# Patient Record
Sex: Male | Born: 1985 | Race: White | Hispanic: No | Marital: Single | Smoking: Former smoker
Health system: Southern US, Community
[De-identification: ages and names within clinical notes are randomized; demographics above are authoritative.]

## PROBLEM LIST (undated history)

## (undated) HISTORY — PX: HERNIA REPAIR: SHX51

---

## 2006-08-28 ENCOUNTER — Emergency Department (HOSPITAL_COMMUNITY): Admission: EM | Admit: 2006-08-28 | Discharge: 2006-08-28 | Payer: Self-pay | Admitting: Emergency Medicine

## 2007-08-17 ENCOUNTER — Emergency Department (HOSPITAL_COMMUNITY): Admission: EM | Admit: 2007-08-17 | Discharge: 2007-08-17 | Payer: Self-pay | Admitting: Emergency Medicine

## 2008-04-03 ENCOUNTER — Emergency Department (HOSPITAL_COMMUNITY): Admission: EM | Admit: 2008-04-03 | Discharge: 2008-04-03 | Payer: Self-pay | Admitting: Emergency Medicine

## 2011-01-27 ENCOUNTER — Inpatient Hospital Stay (HOSPITAL_COMMUNITY)
Admission: EM | Admit: 2011-01-27 | Discharge: 2011-01-28 | DRG: 918 | Discharge status: Against Medical Advice | Payer: Self-pay | Attending: Internal Medicine | Admitting: Internal Medicine

## 2011-01-27 DIAGNOSIS — F3289 Other specified depressive episodes: Secondary | ICD-10-CM | POA: Diagnosis present

## 2011-01-27 DIAGNOSIS — F329 Major depressive disorder, single episode, unspecified: Secondary | ICD-10-CM | POA: Diagnosis present

## 2011-01-27 DIAGNOSIS — F1210 Cannabis abuse, uncomplicated: Secondary | ICD-10-CM | POA: Diagnosis present

## 2011-01-27 DIAGNOSIS — T391X1A Poisoning by 4-Aminophenol derivatives, accidental (unintentional), initial encounter: Principal | ICD-10-CM | POA: Diagnosis present

## 2011-01-27 DIAGNOSIS — E876 Hypokalemia: Secondary | ICD-10-CM | POA: Diagnosis not present

## 2011-01-27 DIAGNOSIS — F17200 Nicotine dependence, unspecified, uncomplicated: Secondary | ICD-10-CM | POA: Diagnosis present

## 2011-01-27 DIAGNOSIS — T394X2A Poisoning by antirheumatics, not elsewhere classified, intentional self-harm, initial encounter: Secondary | ICD-10-CM | POA: Diagnosis present

## 2011-01-27 LAB — HEPATIC FUNCTION PANEL
ALT: 10 U/L (ref 0–53)
AST: 13 U/L (ref 0–37)
Alkaline Phosphatase: 55 U/L (ref 39–117)
Bilirubin, Direct: 0.1 mg/dL (ref 0.0–0.3)
Indirect Bilirubin: 0.5 mg/dL (ref 0.3–0.9)

## 2011-01-27 LAB — BASIC METABOLIC PANEL
Calcium: 9.6 mg/dL (ref 8.4–10.5)
Chloride: 101 mEq/L (ref 96–112)
Creatinine, Ser: 0.67 mg/dL (ref 0.4–1.5)
GFR calc Af Amer: >60 mL/min (ref >60)
Potassium: 3.5 mEq/L (ref 3.5–5.1)

## 2011-01-27 LAB — DIFFERENTIAL
Basophils Absolute: 0 10*3/uL (ref 0.0–0.1)
Basophils Relative: 1 % (ref 0–1)
Eosinophils Absolute: 0.1 10*3/uL (ref 0.0–0.7)
Eosinophils Relative: 1 % (ref 0–5)
Lymphocytes Relative: 29 % (ref 12–46)
Monocytes Relative: 6 % (ref 3–12)
Neutro Abs: 5.5 10*3/uL (ref 1.7–7.7)

## 2011-01-27 LAB — CBC
MCV: 88.5 fL (ref 78.0–100.0)
Platelets: 300 10*3/uL (ref 150–400)
RBC: 5.48 MIL/uL (ref 4.22–5.81)
WBC: 8.7 10*3/uL (ref 4.0–10.5)

## 2011-01-27 LAB — COMPREHENSIVE METABOLIC PANEL
AST: 14 U/L (ref 0–37)
Alkaline Phosphatase: 70 U/L (ref 39–117)
BUN: 10 mg/dL (ref 6–23)
Creatinine, Ser: 0.89 mg/dL (ref 0.4–1.5)
GFR calc Af Amer: >60 mL/min (ref >60)
Glucose, Bld: 98 mg/dL (ref 70–99)
Sodium: 141 mEq/L (ref 135–145)
Total Bilirubin: 0.4 mg/dL (ref 0.3–1.2)
Total Protein: 7.8 g/dL (ref 6.0–8.3)

## 2011-01-27 LAB — ACETAMINOPHEN LEVEL: Acetaminophen (Tylenol), Serum: <15 ug/mL (ref 10–30)

## 2011-01-27 LAB — RAPID URINE DRUG SCREEN, HOSP PERFORMED
Barbiturates: NOT DETECTED
Opiates: NOT DETECTED

## 2011-01-27 LAB — ETHANOL: Alcohol, Ethyl (B): 20 mg/dL — ABNORMAL HIGH (ref 0–10)

## 2011-01-27 LAB — MRSA PCR SCREENING: MRSA by PCR: NEGATIVE

## 2011-01-28 ENCOUNTER — Inpatient Hospital Stay (HOSPITAL_COMMUNITY)
Admission: AD | Admit: 2011-01-28 | Discharge: 2011-01-30 | DRG: 897 | Disposition: A | Discharge status: Home / Self Care | Payer: 59 | Attending: Psychiatry | Admitting: Psychiatry

## 2011-01-28 DIAGNOSIS — T391X1A Poisoning by 4-Aminophenol derivatives, accidental (unintentional), initial encounter: Secondary | ICD-10-CM

## 2011-01-28 DIAGNOSIS — F1210 Cannabis abuse, uncomplicated: Principal | ICD-10-CM

## 2011-01-28 DIAGNOSIS — T394X2A Poisoning by antirheumatics, not elsewhere classified, intentional self-harm, initial encounter: Secondary | ICD-10-CM

## 2011-01-28 DIAGNOSIS — F3289 Other specified depressive episodes: Secondary | ICD-10-CM

## 2011-01-28 DIAGNOSIS — F329 Major depressive disorder, single episode, unspecified: Secondary | ICD-10-CM

## 2011-01-28 DIAGNOSIS — Z560 Unemployment, unspecified: Secondary | ICD-10-CM

## 2011-01-28 DIAGNOSIS — F17200 Nicotine dependence, unspecified, uncomplicated: Secondary | ICD-10-CM

## 2011-01-28 DIAGNOSIS — E876 Hypokalemia: Secondary | ICD-10-CM

## 2011-01-28 DIAGNOSIS — F1010 Alcohol abuse, uncomplicated: Secondary | ICD-10-CM

## 2011-01-28 DIAGNOSIS — T398X2A Poisoning by other nonopioid analgesics and antipyretics, not elsewhere classified, intentional self-harm, initial encounter: Secondary | ICD-10-CM

## 2011-01-28 LAB — CBC
MCH: 30.5 pg (ref 26.0–34.0)
MCV: 88.2 fL (ref 78.0–100.0)
RBC: 5.24 MIL/uL (ref 4.22–5.81)
RDW: 12.7 % (ref 11.5–15.5)
WBC: 10.8 10*3/uL — ABNORMAL HIGH (ref 4.0–10.5)

## 2011-01-28 LAB — COMPREHENSIVE METABOLIC PANEL
Albumin: 4 g/dL (ref 3.5–5.2)
BUN: 9 mg/dL (ref 6–23)
CO2: 25 mEq/L (ref 19–32)
Chloride: 105 mEq/L (ref 96–112)
GFR calc Af Amer: >60 mL/min (ref >60)
Glucose, Bld: 111 mg/dL — ABNORMAL HIGH (ref 70–99)
Potassium: 3.3 mEq/L — ABNORMAL LOW (ref 3.5–5.1)

## 2011-01-28 LAB — PROTIME-INR: Prothrombin Time: 16.3 seconds — ABNORMAL HIGH (ref 11.6–15.2)

## 2011-01-28 LAB — DIFFERENTIAL
Basophils Relative: 0 % (ref 0–1)
Lymphs Abs: 3.7 10*3/uL (ref 0.7–4.0)
Monocytes Absolute: 0.9 10*3/uL (ref 0.1–1.0)

## 2011-01-29 DIAGNOSIS — Z630 Problems in relationship with spouse or partner: Secondary | ICD-10-CM

## 2011-01-29 NOTE — Discharge Summary (Signed)
NAME:  Erik Branch, HIGHLEY NO.:  [id number]  MEDICAL RECORD NO.:  [id number]           PATIENT TYPE:  I  LOCATION:                                 FACILITY:  PHYSICIAN:  Elliot Cousin, M.D.    DATE OF BIRTH:  12/30/1985  DATE OF ADMISSION:  01/28/2011 DATE OF DISCHARGE:  LH                         DISCHARGE SUMMARY-REFERRING   DISCHARGE DIAGNOSES: 1. Probable attempted suicide with intentional Tylenol (acetaminophen)     overdose. 2. The patient was involuntarily committed; however, he eloped from     the hospital.  The police authorities were notified.  The patient     had already been medically cleared for transfer to an inpatient     psychiatric facility. 3. Tobacco and marijuana abuse. 4. Hypokalemia. 5. Possible depression.  DISCHARGE MEDICATIONS:  None as the patient eloped from the hospital.  CONSULTATIONS:  The ACT Team representative, Shon Baton.  PROCEDURE PERFORMED:  None.  HISTORY OF PRESENT ILLNESS:  The patient is a 25 year old man with no significant past medical history, who was brought to the emergency department by the [facility] Police for evaluation of an apparent attempted suicide with acetaminophen, Windex, and AWESOME cleaner.  The patient admitted that he only took five Tylenol tablets and not the whole bottle.  He also stated that he poured the bottle of Windex and AWESOME down the drain although he wanted to make his fiance believe that he drunk both.  He also had written a suicide note, which was witnessed by the police.  In the emergency department, the patient was noted to be hemodynamically stable and afebrile.  His urine drug screen was positive only for THC.  His blood acetaminophen level was 21.7 (10-30 normal range).  His salicylate level was less than two.  His blood alcohol level was 20. His liver transaminases were all within normal limits.  He was admitted for further evaluation and management.  HOSPITAL COURSE:   The patient was started on acetylcysteine per the acetaminophen overdose protocol.  Maintenance IV fluids were initiated and continued for approximately 24 hours.  His blood acetaminophen level and liver transaminases were monitored closely.  Several hours after admission, his acetaminophen level decreased to less than 15.  The following morning, it remained at less than 15.  His liver transaminases remained well within normal limits.  His serum potassium did decrease to 3.3 which was likely the consequence of the IV fluids.  He was given 30 mEq of potassium chloride orally as ordered.  His TSH was within normal limits at 0.59.  His free T4 was within normal limits at 1.45.  His blood magnesium level was within normal limits at 2.0.  The ACT team was consulted.  Ms. Jacqulyn Ducking evaluated the patient and recommended further treatment in an inpatient psychiatric facility. The patient was informed that the recommendation was for him to be transferred to behavioral health for further inpatient treatment. Apparently, he signed the appropriate form for voluntary commitment. Nevertheless, Ms. Jacqulyn Ducking thought that the patient was a flight risk. Therefore, involuntary commitment forms were signed by the dictating physician.  Shortly after the forms had  been signed, the patient fleed his hospital room.  He was able to get away.  The [facility] Department was called immediately.  Currently, based on the latest report, he has not been apprehended.  Once he is apprehended, the patient should be transferred directly to [facility] as he was medically cleared.      Elliot Cousin, M.D.     DF/MEDQ  D:  01/28/2011  T:  01/28/2011  Job:  [id number]  Electronically Signed by Elliot Cousin M.D. on 01/29/2011 05:33:35 PM

## 2011-01-29 NOTE — H&P (Signed)
NAME:  Erik Branch, SMOLINSKY NO.:  [id number]  MEDICAL RECORD NO.:  [id number]           PATIENT TYPE:  I  LOCATION:  IC10                          FACILITY:  [facility]  PHYSICIAN:  Elliot Cousin, M.D.    DATE OF BIRTH:  02/17/1986  DATE OF ADMISSION:  01/27/2011 DATE OF DISCHARGE:  LH                             HISTORY & PHYSICAL   PRIMARY CARE PHYSICIAN:  The patient is unassigned.  CHIEF COMPLAINT:  Reported acetaminophen overdose and possible suicide attempt.  HISTORY OF PRESENT ILLNESS:  The patient is a 25 year old man with no significant past medical history, who presented to the emergency department today with a report that he took a bottle of Tylenol and drank Windex and another cleaner called Awesome this morning.  The patient admits that he was arguing with his fiance. He wanted to upset her and scare her.  Therefore, he gestured as if he had drunk a bottle of Windex and Awesome.  He admits taking Tylenol which he takes 2 to 3 daily for headaches.  He emptied the remaining Tylenol tablets in his back pocket and showed his fiance the empty bottle.  He states that he wanted her to assume that he had a drunk the two cleaners and took the bottle of Tylenol; however, he did not.  He actually poured the cleaners down the sink and as stated, placed the remaining Tylenol tablets in his pocket.  His fiance did call the police.  When they came, he denied any suicidal or homicidal thoughts.  His fiance was going to ask them to involuntarily commit him.  However, the patient voluntarily came with the police  to the emergency department at [facility].  According to the information given, there was a suicide note that the patient had written.  He now states that it was meant to scare his fiance, but he did not mean what is said in the note.  He does not recall exactly what was written in the suicide note.  He says now that he has a gun that is registered to him  and if he wanted to kill himself, he would have shot himself.  He strongly denies any feelings of suicide. He states that he wants to live for his son.  He has no prior history of suicide attempts.  He has no history of depression or any other psychiatric illnesses according to him.  During the initial evaluation, the patient was noted to be afebrile and hemodynamically stable.  In the emergency department, a number of studies were ordered.  His urine drug screen was positive only for THC. His blood alcohol level was 20.  His blood acetaminophen level was 21.7 (10-30 normal range).  His salicylate level was less than 2.  His liver transaminases were within normal limits.  He is being admitted for further evaluation and management.  PAST MEDICAL HISTORY: 1. Occasional headaches. 2. Tobacco abuse. 3. Marijuana use. 4. Status post hernia repair as an infant.  MEDICATIONS:  None.  ALLERGIES:  No known drug allergies.  SOCIAL HISTORY:  The patient is single.  He lives with  his fiancee.  He has one son.  He is currently unemployed.  He lives in [place].  He smokes 1 to 2 packs of cigarettes per day.  He smokes marijuana approximately 2 to 3 times per month.  He denies any other illicit drug use.  He drinks 3 to 4 beers weekly.  He did not graduate from high school.  He dropped out in the 11th grade.  FAMILY HISTORY:  His mother is 9 years of age and has no significant medical history.  His father is in his 40s.  He is essentially healthy. He has several siblings with no medical problems.  REVIEW OF SYSTEMS:  As above in the history present illness.  Otherwise his review of systems is negative.  PHYSICAL EXAMINATION:  VITAL SIGNS:  (The patient is being examined in the ICU).  Current temperature is 97.8, pulse 68, respiratory rate 218, oxygen saturation 98% on room air blood pressure 135/80. GENERAL:  The patient is a pleasant 25 year old Caucasian man who is currently sitting  up in bed in no acute distress. HEENT:  Head is normocephalic and atraumatic.  Pupils equal, round, and reactive to light.  Extraocular muscles are intact.  Conjunctivae are clear.  Sclerae are white.  Tympanic membranes not examined.  Nasal mucosa is mildly dry.  No sinus tenderness.  Oropharynx reveals good dentition.  Mucous membranes moist.  No posterior exudates or erythema. NECK:  Supple.  No adenopathy.  No thyromegaly.  No bruits.  No JVD. LUNGS:  Clear to auscultation bilaterally. HEART:  S1, S2 with borderline bradycardia. ABDOMEN:  Positive bowel sounds, soft, nontender, nondistended.  No hepatosplenomegaly.  No masses palpated. GU AND RECTAL:  Deferred. EXTREMITIES:  Pedal pulses are palpable bilaterally.  No pretibial edema and no pedal edema. NEUROLOGIC/PSYCHOLOGICAL:  The patient is alert and oriented x3.  His affect is actually pleasant.  He is very cooperative.  Cranial nerves II through XII are intact.  Strength is 5/5 throughout.  Sensation is intact.  The patient denies any feelings of suicide.  He states that he was only gesturing suicide to upset his fiancee.  He says that if he really wanted to kill himself, he would have used his gun to shoot himself.  When he was asked about the suicide note, he says that he wanted to make it seem real to upset his fiancee.  He now wishes that he has not written the note.  He wishes that he had not gestured suicide at all.  LABORATORY DATA:  Admission laboratories; salicylate level less than two.  Acetaminophen level 21.7, alcohol level 20.  Sodium 141, potassium 3.8, chloride 103, CO2 of 28, glucose 98, BUN 10, creatinine 0.89, total bilirubin 0.4, alkaline phosphatase 70, SGOT 14. SGPT 11, total protein 7.8, albumin 5.1, calcium 10.1.  Urine drug screen positive for THC. WBC 8.7, hemoglobin 16.8, platelet count 300.  ASSESSMENT: 1. The patient is a 25 year old man with no prior history of     depression or suicide  attempts, who is being admitted for a     possible suicide attempt with intentional Tylenol overdose.  It was     also reported that the patient drunk two cleaning solutions, but he     adamantly denies this.  He also denies taking more Tylenol than     what is prescribed.  His acetaminophen level is elevated, but not     in the toxic range.  He says that his last Tylenol dose was at  approximately 3:00 a.m. this morning.  He takes Tylenol on a     regular basis for treatment of headaches.  His liver transaminases     are currently within normal limits.  His EKG reveals normal sinus     rhythm with a heart rate of 60 beats per minute but no ST or T-wave     abnormalities.  He is hemodynamically stable and asymptomatic. 2. Tobacco and marijuana abuse.  The patient was advised to stop both.     A nicotine patch was placed earlier.  PLAN: 1. The patient was given a loading dose of intravenous acetylcysteine.     We will continue the acetylcysteine protocol intravenously. 2. We will start IV fluids empirically for added hydration. 3. We will order a one-on-one sitter for suicide precautions. 4. We will consult the ACT team in the morning as it is anticipated     that he will be medically cleared tomorrow. 5. Supportive treatment. 6. Tobacco cessation counseling.  Continue nicotine patch for now.     The patient was advised to stop smoking both marijuana and tobacco. 7. We will monitor his acetaminophen levels and liver transaminases     daily.     Elliot Cousin, M.D.     DF/MEDQ  D:  01/27/2011  T:  01/27/2011  Job:  [id number]  Electronically Signed by Elliot Cousin M.D. on 01/29/2011 05:19:46 PM

## 2011-01-30 NOTE — H&P (Signed)
NAME:  Erik Branch, Branch NO.:  [id number]  MEDICAL RECORD NO.:  [id number]           PATIENT TYPE:  I  LOCATION:  0503                          FACILITY:  [facility]  PHYSICIAN:  Franchot Gallo, MD     DATE OF BIRTH:  [birth year]  DATE OF ADMISSION:  01/28/2011 DATE OF DISCHARGE:                      PSYCHIATRIC ADMISSION ASSESSMENT   CHIEF COMPLAINT:  "I pretended like I was trying to kill myself."  HISTORY OF PRESENT ILLNESS:  Mr. Erik Branch Branch is a 25 year old single white male, who was admitted to the Behavioral Health after reportedly pretending that he had overdosed on a mixture of cleaning fluids and acetaminophen.  The patient states that he and his live-in girlfriend had argued and he took 2 bottles of cleaning fluid and "poured them down the drain," and then states that he took a bottle of Tylenol and poured some on the counter and then hid several in his pocket.  He reports that he then went to bed and pretended he had overdosed.  When his girlfriend returned from a walk, she reportedly called 911, and he was brought to the emergency room for clearance.  The patient states that he had been drinking beer at the time of this event.  Today, the patient states that he is sleeping well without difficulty and reports a good appetite.  He denies any significant feelings of sadness, anhedonia or depressed mood.  He also denies any suicidal or homicidal ideations.  The patient also denies any past attempts to actually harm himself.  The patient denies any past or current prolonged manic or hypomanic symptoms.  He also denies any auditory or visual hallucinations or delusional thinking.  The patient does report to occasionally drinking alcohol, but as stated above reports to drinking 6 beers prior to the above suicide "gesture." The patient reports smoking 2 packs of cigarettes per day and reports that he occasionally uses cannabis.  He reports that he was using cannabis  on a daily basis until the last several years, reporting "he can longer afford it."  The patient was admitted for evaluation of the above, as well as treatment recommendations.  PAST PSYCHIATRIC HISTORY:  The patient denies any past psychiatric hospitalizations or use of psychiatric medications.  PAST MEDICAL HISTORY:  CURRENT MEDICATIONS:  None reported.  ALLERGIES:  NKDA.  MEDICAL ILLNESSES:  None reported.  PAST OPERATIONS:  None reported.  FAMILY HISTORY:  The patient denies any family history of psychiatric or substance abuse related illnesses.  SOCIAL HISTORY:  The [place] with his 26-month-old son and his girlfriend.  He states that he completed the 11th grade of school and is currently unemployed.  The patient reports to smoking 2 packs of cigarettes per day, as well as occasional use of alcohol.  He reports a history of cannabis abuse, but denies any recent regular use of cannabis or other illicit drugs.  MENTAL STATUS EXAM:  General:  The patient was alert and oriented x3 and was friendly and cooperative throughout the evaluation.  Speech was appropriate in terms of rate and volume.  Mood appeared essentially euthymic.  Affect  was full.  Thoughts:  The patient denied any auditory or visual hallucinations or delusional thinking.  He also denied any suicidal or homicidal ideations.  Judgment and insight today both appeared fair to good.  IMPRESSION:  Axis  I: 1. Partner relational problem. 2. Cannabis abuse, episodic usage. 3. Rule out alcohol abuse. 4. Nicotine dependence. Axis II:  Deferred. Axis III:  None reported. Axis IV:  Limited primary support system.  Financial constraints. Unemployment. Axis V:  Global Assessment of Functioning at time of admission approximately 70.  Highest Global Assessment of Functioning in the past year also 70.  PLAN: 1. The patient was taken off if his holding orders since the  patient     agreed to remain in the hospital for further observation. 2. The patient will be monitored throughout the night and social work     will attempt to obtain collateral information from his family or     girlfriend. 3. Unless additional information is gathered, the patient will likely     be discharged in the a.m.    _________________________________ Franchot Gallo, MD     RR/MEDQ  D:  01/29/2011  T:  01/30/2011  Job:  [id number]  Electronically Signed by Franchot Gallo MD on 01/30/2011 04:24:55 PM

## 2011-02-01 NOTE — Discharge Summary (Signed)
  NAME:  MENA, LIENAU NO.:  [id number]  MEDICAL RECORD NO.:  [id number]           PATIENT TYPE:  I  LOCATION:  0503                          FACILITY:  [facility]  PHYSICIAN:  Franchot Gallo, MD     DATE OF BIRTH:  [birth year]  DATE OF ADMISSION:  01/28/2011 DATE OF DISCHARGE:  01/30/2011                              DISCHARGE SUMMARY   REASON FOR ADMISSION:  This is a 25 year old male who was admitted after he reportedly pretended that he overdosed on a mixture cleaning fluids and acetaminophen.  He and his live-in girlfriend had argued, and he reported taking 2 bottles of cleaning fluid and poured them down the drain and then, he states he took a bottle of Tylenol and poured some on the counter and hid them in his pocket.  FINAL IMPRESSION:  Axis I:  Partner relational problems; cannabis abuse, episodic usage; rule out alcohol abuse; nicotine dependence. Axis II:  Deferred. Axis III:  No known medical conditions. Axis IV:  Limited primary support, financial constraints and unemployment. Axis V:  Global Assessment of Functioning 70.  PERTINENT LABS:  Urine drug screen is positive for cannabis.  CMP within normal limits.  Alcohol level of 20.  Salicylate level less than 2. Acetaminophen level at 21.7.  CBC within normal limits.  SIGNIFICANT FINDINGS:  The patient was admitted to the adult milieu and to the mood disorder group.  The patient was alert, oriented, friendly and cooperative.  Speech was appropriate.  Mood appeared essentially euthymic.  Affect was full.  He denied any auditory or visual hallucinations or delusional thinking.  He denied any suicidal or homicidal thoughts.  His judgment and insight appeared to be good.  We released the patient from his involuntary petition, and he was monitored throughout the night to obtain collateral information from his support system.  We contacted the patient's girlfriend via telephone to gather collateral  information and to assess safety issues and to educate on suicide prevention.  Day of discharge, the patient was sleeping well. His appetite was good.  He denied any depressive symptoms.  He denied any suicidal or homicidal thoughts or auditory hallucinations.  The patient was stable for discharge.  He was discharged on no medications.  DISCHARGE MEDICATIONS:  None.  FOLLOWUP APPOINTMENT:  At [facility] on Wednesday, May 9 at 8 a.m., phone number [phone].     Landry Corporal, N.P.   ______________________________ Franchot Gallo, MD    JO/MEDQ  D:  01/31/2011  T:  01/31/2011  Job:  [id number]  Electronically Signed by Limmie PatriciaP. on 02/01/2011 09:15:40 AM Electronically Signed by Franchot Gallo MD on 02/01/2011 04:00:23 PM

## 2011-03-15 ENCOUNTER — Emergency Department (HOSPITAL_COMMUNITY)
Admission: EM | Admit: 2011-03-15 | Discharge: 2011-03-15 | Disposition: A | Discharge status: Home / Self Care | Payer: Self-pay | Attending: Emergency Medicine | Admitting: Emergency Medicine

## 2011-03-15 DIAGNOSIS — M65839 Other synovitis and tenosynovitis, unspecified forearm: Secondary | ICD-10-CM | POA: Insufficient documentation

## 2011-03-16 ENCOUNTER — Emergency Department (HOSPITAL_COMMUNITY)
Admission: EM | Admit: 2011-03-16 | Discharge: 2011-03-16 | Disposition: A | Discharge status: Home / Self Care | Payer: Self-pay | Attending: Emergency Medicine | Admitting: Emergency Medicine

## 2011-03-16 DIAGNOSIS — F1210 Cannabis abuse, uncomplicated: Secondary | ICD-10-CM | POA: Insufficient documentation

## 2011-03-16 DIAGNOSIS — M779 Enthesopathy, unspecified: Secondary | ICD-10-CM | POA: Insufficient documentation

## 2011-03-16 DIAGNOSIS — F17200 Nicotine dependence, unspecified, uncomplicated: Secondary | ICD-10-CM | POA: Insufficient documentation

## 2012-09-27 ENCOUNTER — Emergency Department (HOSPITAL_COMMUNITY)
Admission: EM | Admit: 2012-09-27 | Discharge: 2012-09-27 | Disposition: A | Discharge status: Home / Self Care | Payer: Self-pay | Attending: Emergency Medicine | Admitting: Emergency Medicine

## 2012-09-27 ENCOUNTER — Encounter (HOSPITAL_COMMUNITY): Payer: Self-pay | Admitting: *Deleted

## 2012-09-27 DIAGNOSIS — M542 Cervicalgia: Secondary | ICD-10-CM | POA: Insufficient documentation

## 2012-09-27 DIAGNOSIS — F17200 Nicotine dependence, unspecified, uncomplicated: Secondary | ICD-10-CM | POA: Insufficient documentation

## 2012-09-27 DIAGNOSIS — H6091 Unspecified otitis externa, right ear: Secondary | ICD-10-CM

## 2012-09-27 DIAGNOSIS — H60399 Other infective otitis externa, unspecified ear: Secondary | ICD-10-CM | POA: Insufficient documentation

## 2012-09-27 MED ORDER — CIPROFLOXACIN-DEXAMETHASONE 0.3-0.1 % OT SUSP
3.0000 [drp] | Freq: Two times a day (BID) | OTIC | Status: DC
Start: 2012-09-27 — End: 2012-09-28
  Administered 2012-09-27: 3 [drp] via OTIC
  Filled 2012-09-27: qty 7.5

## 2012-09-27 MED ORDER — CIPROFLOXACIN-DEXAMETHASONE 0.3-0.1 % OT SUSP
OTIC | Status: AC
  Filled 2012-09-27: qty 7.5

## 2012-09-27 NOTE — ED Notes (Signed)
Pt with pain to right ear x 1 month ago, started as a pimple per pt, states pain radiates down into neck, denies fever or N/V

## 2012-09-27 NOTE — ED Notes (Signed)
Pt instructed to take ear drops twice daily 3 drops for 7 days, pt verbalized understanding

## 2012-09-28 NOTE — ED Provider Notes (Signed)
History     CSN: [id number]  Arrival date & time 09/27/12  2052   First MD Initiated Contact with Patient 09/27/12 2108      Chief Complaint  Patient presents with  . Otalgia    (Consider location/radiation/quality/duration/timing/severity/associated sxs/prior treatment) HPI Comments: Erik Branch is a 27 y.o. Male with a history of a small pimple in his right ear canal which has been intermittent,  Stating he had one on the lower canal which resolved, but now has developed on the upper canal just inside the canal border.  There has been no drainage from the ear.  He reports constant pain which now radiates into his right lateral neck.  There has been no fevers,  Swelling and he denies hearing loss, dizziness or any other complaints.  He has taken ibuprofen without relief.     The history is provided by the patient.    History reviewed. No pertinent past medical history.  Past Surgical History  Procedure Date  . Hernia repair     History reviewed. No pertinent family history.  History  Substance Use Topics  . Smoking status: Current Every Day Smoker    Types: Cigarettes  . Smokeless tobacco: Not on file  . Alcohol Use: No      Review of Systems  Constitutional: Negative for fever.  HENT: Positive for ear pain and neck pain. Negative for hearing loss, congestion, sore throat, facial swelling, neck stiffness and tinnitus.   Respiratory: Negative for shortness of breath.     Allergies  Review of patient's allergies indicates no known allergies.  Home Medications   Current Outpatient Rx  Name  Route  Sig  Dispense  Refill  . IBUPROFEN 200 MG PO TABS   Oral   Take 800 mg by mouth every 6 (six) hours as needed. pain           BP 125/74  Pulse 64  Temp 98 F (36.7 C) (Oral)  Resp 18  Ht 5\' 4"  (1.626 m)  Wt 161 lb (73.029 kg)  BMI 27.64 kg/m2  SpO2 100%  Physical Exam  Constitutional: He is oriented to person, place, and time. He appears  well-developed and well-nourished.  HENT:  Head: Normocephalic and atraumatic.  Right Ear: Tympanic membrane normal.  Left Ear: Tympanic membrane and ear canal normal.  Nose: Mucosal edema and rhinorrhea present.  Mouth/Throat: Uvula is midline, oropharynx is clear and moist and mucous membranes are normal. Normal dentition. No oropharyngeal exudate, posterior oropharyngeal edema, posterior oropharyngeal erythema or tonsillar abscesses.       Small pustule superior distal right ear canal.  There is no drainage from the site.    Eyes: Conjunctivae normal are normal.  Cardiovascular: Normal rate and normal heart sounds.   Pulmonary/Chest: Effort normal. No respiratory distress. He has no wheezes. He has no rales.  Abdominal: Soft. There is no tenderness.  Musculoskeletal: Normal range of motion.  Lymphadenopathy:       No head or neck adenopathy.  Neurological: He is alert and oriented to person, place, and time.  Skin: Skin is warm and dry. No rash noted.  Psychiatric: He has a normal mood and affect.    ED Course  Procedures (including critical care time)  INCISION AND DRAINAGE Performed by: Burgess Amor Consent: Verbal consent obtained. Risks and benefits: risks, benefits and alternatives were discussed Type: abscess  Body area: right ear canal  Anesthesia: local infiltration  Needle aspiration with 18 gauge needle through pustule dome  Local anesthetic:none  Anesthetic total: none Complexity: simple Drainage: purulent  Drainage amount: small - cotton swab used with gentle pressure with small amount of purulence obtained Packing material: not applicable Patient tolerance: Patient tolerated the procedure well with no immediate complications.     Labs Reviewed - No data to display No results found.   1. External otitis of right ear       MDM  Pt prescribed ciprodex, encouraged bid application with cotton swab.          Burgess Amor, [place] 09/28/12 1624

## 2012-09-30 NOTE — ED Provider Notes (Signed)
Medical screening examination/treatment/procedure(s) were performed by non-physician practitioner and as supervising physician I was immediately available for consultation/collaboration.   Zell Doucette W. Edison Nicholson, MD 09/30/12 1110 

## 2013-02-19 ENCOUNTER — Emergency Department (HOSPITAL_COMMUNITY)
Admission: EM | Admit: 2013-02-19 | Discharge: 2013-02-19 | Disposition: A | Discharge status: Home / Self Care | Payer: Self-pay | Attending: Emergency Medicine | Admitting: Emergency Medicine

## 2013-02-19 ENCOUNTER — Encounter (HOSPITAL_COMMUNITY): Payer: Self-pay | Admitting: Emergency Medicine

## 2013-02-19 DIAGNOSIS — F17200 Nicotine dependence, unspecified, uncomplicated: Secondary | ICD-10-CM | POA: Insufficient documentation

## 2013-02-19 DIAGNOSIS — L0291 Cutaneous abscess, unspecified: Secondary | ICD-10-CM

## 2013-02-19 DIAGNOSIS — H538 Other visual disturbances: Secondary | ICD-10-CM | POA: Insufficient documentation

## 2013-02-19 DIAGNOSIS — H5789 Other specified disorders of eye and adnexa: Secondary | ICD-10-CM | POA: Insufficient documentation

## 2013-02-19 DIAGNOSIS — H00039 Abscess of eyelid unspecified eye, unspecified eyelid: Secondary | ICD-10-CM | POA: Insufficient documentation

## 2013-02-19 MED ORDER — BACITRACIN ZINC 500 UNIT/GM EX OINT
TOPICAL_OINTMENT | CUTANEOUS | Status: AC
  Administered 2013-02-19: 1
  Filled 2013-02-19: qty 0.9

## 2013-02-19 MED ORDER — SULFAMETHOXAZOLE-TMP DS 800-160 MG PO TABS
1.0000 | ORAL_TABLET | Freq: Once | ORAL | Status: AC
  Administered 2013-02-19: 1 via ORAL
  Filled 2013-02-19: qty 1

## 2013-02-19 MED ORDER — SULFAMETHOXAZOLE-TRIMETHOPRIM 800-160 MG PO TABS: 1.0000 | ORAL_TABLET | Freq: Two times a day (BID) | ORAL | Status: DC

## 2013-02-19 MED ORDER — HYDROCODONE-ACETAMINOPHEN 5-325 MG PO TABS: ORAL_TABLET | ORAL | Status: DC

## 2013-02-19 NOTE — ED Notes (Signed)
Pain, swelling rt eye , x 5 days, Upper lid swollen, red with pustule .

## 2013-02-19 NOTE — ED Provider Notes (Signed)
History     CSN: [id number]  Arrival date & time 02/19/13  1448   First MD Initiated Contact with Patient 02/19/13 1525      Chief Complaint  Patient presents with  . Eye Pain    (Consider location/radiation/quality/duration/timing/severity/associated sxs/prior treatment) HPI Comments: Patient c/o red "bump" to the medial right upper eyelid.  States he noticed a "whitehead" to it few days ago and now has tenderness , drainage and redness to the area.  He also states that his eye is crusted and swollen shut in the mornings.  He reports having slightly blurred vision to the right eye with medial gaze.  He denies fever, neck pain, headache, dizziness or contact use.    Patient is a 27 y.o. male presenting with eye pain. The history is provided by the patient.  Eye Pain This is a new problem. Episode onset: five days ago. The problem occurs constantly. The problem has been unchanged. Pertinent negatives include no arthralgias, chills, fever, headaches, nausea, neck pain, numbness, rash, sore throat, swollen glands, vertigo, vomiting or weakness. Exacerbated by: bright light and blinking. Treatments tried: warm compresses. The treatment provided no relief.    History reviewed. No pertinent past medical history.  Past Surgical History  Procedure Laterality Date  . Hernia repair      History reviewed. No pertinent family history.  History  Substance Use Topics  . Smoking status: Current Every Day Smoker    Types: Cigarettes  . Smokeless tobacco: Not on file  . Alcohol Use: No      Review of Systems  Constitutional: Negative for fever, chills, activity change and appetite change.  HENT: Negative for sore throat, facial swelling, neck pain and neck stiffness.   Eyes: Positive for pain, discharge and visual disturbance. Negative for photophobia.  Gastrointestinal: Negative for nausea and vomiting.  Musculoskeletal: Negative for arthralgias.  Skin: Negative for rash.    Neurological: Negative for dizziness, vertigo, weakness, numbness and headaches.  All other systems reviewed and are negative.    Allergies  Review of patient's allergies indicates no known allergies.  Home Medications   Current Outpatient Rx  Name  Route  Sig  Dispense  Refill  . ibuprofen (ADVIL,MOTRIN) 200 MG tablet   Oral   Take 800 mg by mouth every 6 (six) hours as needed. pain           BP 135/83  Pulse 110  SpO2 99%  Physical Exam  Nursing note and vitals reviewed. Constitutional: He is oriented to person, place, and time.  HENT:  Head: Normocephalic and atraumatic.  Right Ear: Tympanic membrane and ear canal normal.  Left Ear: Tympanic membrane and ear canal normal.  Mouth/Throat: Oropharynx is clear and moist.  Eyes: Conjunctivae and EOM are normal. Pupils are equal, round, and reactive to light. Right eye exhibits no chemosis, no discharge and no hordeolum.    Neck: Normal range of motion. Neck supple.  Cardiovascular: Normal rate, regular rhythm, normal heart sounds and intact distal pulses.   No murmur heard. Pulmonary/Chest: Effort normal and breath sounds normal. No respiratory distress.  Musculoskeletal: Normal range of motion.  Lymphadenopathy:    He has no cervical adenopathy.  Neurological: He is alert and oriented to person, place, and time. He exhibits normal muscle tone. Coordination normal.  Skin: Skin is warm and dry. No rash noted.    ED Course  Procedures (including critical care time)  Labs Reviewed - No data to display No results found.  MDM     [id number]  Patient also seen by the EDP, Dr. Estell Harpin and care plan discussed.  Pt agrees to frequent warm compresses and close f/u with Dr. Lita Mains tomorrow.  I have discussed the possible need for I&D in 24-48 hrs if no improvement. Pt encouraged to f/u with ophtho if I&D is warranted.  I will patch the eye for comfort and start Septra DS.  Sx's are likely related to MRSA.  No periorbital  edema or erythema.       Danayah Smyre L. Trisha Mangle, PA-C 02/19/13 2144

## 2013-02-19 NOTE — ED Provider Notes (Signed)
Medical screening examination/treatment/procedure(s) were performed by non-physician practitioner and as supervising physician I was immediately available for consultation/collaboration.   Benny Lennert, MD 02/19/13 2351

## 2013-02-19 NOTE — ED Notes (Signed)
Pt c/o r eye pain/swelling x 5 days. Swelling noted to top eyelid at present. Nad.

## 2013-06-28 ENCOUNTER — Emergency Department (HOSPITAL_COMMUNITY)
Admission: EM | Admit: 2013-06-28 | Discharge: 2013-06-28 | Disposition: A | Discharge status: Home / Self Care | Payer: Self-pay | Attending: Emergency Medicine | Admitting: Emergency Medicine

## 2013-06-28 ENCOUNTER — Encounter (HOSPITAL_COMMUNITY): Payer: Self-pay | Admitting: Emergency Medicine

## 2013-06-28 DIAGNOSIS — F17200 Nicotine dependence, unspecified, uncomplicated: Secondary | ICD-10-CM | POA: Insufficient documentation

## 2013-06-28 DIAGNOSIS — H60399 Other infective otitis externa, unspecified ear: Secondary | ICD-10-CM | POA: Insufficient documentation

## 2013-06-28 DIAGNOSIS — H6002 Abscess of left external ear: Secondary | ICD-10-CM

## 2013-06-28 MED ORDER — ANTIPYRINE-BENZOCAINE 5.4-1.4 % OT SOLN
OTIC | Status: AC
  Administered 2013-06-28: 22:00:00
  Filled 2013-06-28: qty 10

## 2013-06-28 MED ORDER — SULFAMETHOXAZOLE-TMP DS 800-160 MG PO TABS
1.0000 | ORAL_TABLET | Freq: Once | ORAL | Status: AC
  Administered 2013-06-28: 1 via ORAL
  Filled 2013-06-28: qty 1

## 2013-06-28 MED ORDER — SULFAMETHOXAZOLE-TRIMETHOPRIM 800-160 MG PO TABS: 1.0000 | ORAL_TABLET | Freq: Two times a day (BID) | ORAL | Status: DC

## 2013-06-28 MED ORDER — OXYCODONE-ACETAMINOPHEN 5-325 MG PO TABS
1.0000 | ORAL_TABLET | Freq: Once | ORAL | Status: AC
  Administered 2013-06-28: 1 via ORAL
  Filled 2013-06-28: qty 1

## 2013-06-28 MED ORDER — HYDROCODONE-ACETAMINOPHEN 5-325 MG PO TABS: 1.0000 | ORAL_TABLET | ORAL | Status: DC | PRN

## 2013-06-28 NOTE — ED Provider Notes (Signed)
CSN: [id number]     Arrival date & time 06/28/13  2026 History   First MD Initiated Contact with Patient 06/28/13 2152     Chief Complaint  Patient presents with  . Otalgia   (Consider location/radiation/quality/duration/timing/severity/associated sxs/prior Treatment) HPI Erik Branch is a 27 y.o. male who presents to the ED with pain in his left ear that started about 2 weeks ago. He states a small tender place in the ear canal started and has gotten larger and more painful. Patient had similar area about a month ago and states he was treated with antibiotics and ear drops and the bump went away but then came back.  He denies any other problems.  History reviewed. No pertinent past medical history. Past Surgical History  Procedure Laterality Date  . Hernia repair     History reviewed. No pertinent family history. History  Substance Use Topics  . Smoking status: Current Every Day Smoker    Types: Cigarettes  . Smokeless tobacco: Not on file  . Alcohol Use: No    Review of Systems  Constitutional: Negative for fever and chills.  HENT: Positive for ear pain. Negative for sore throat and neck pain.   Eyes: Negative for visual disturbance.  Respiratory: Negative for cough.   Cardiovascular: Negative for chest pain.  Gastrointestinal: Negative for nausea, vomiting and abdominal pain.  Musculoskeletal: Negative for gait problem.  Skin: Negative for rash.  Allergic/Immunologic: Negative for immunocompromised state.  Neurological: Negative for headaches.  Psychiatric/Behavioral: The patient is not nervous/anxious.     Allergies  Review of patient's allergies indicates no known allergies.  Home Medications   Current Outpatient Rx  Name  Route  Sig  Dispense  Refill  . ibuprofen (ADVIL,MOTRIN) 200 MG tablet   Oral   Take 800 mg by mouth every 6 (six) hours as needed. pain         . naproxen sodium (ALEVE) 220 MG tablet   Oral   Take 440 mg by mouth daily as needed.          BP 128/75  Pulse 66  Temp(Src) 98.1 F (36.7 C) (Oral)  Resp 20  Ht 5\' 5"  (1.651 m)  Wt 153 lb (69.4 kg)  BMI 25.46 kg/m2  SpO2 100% Physical Exam  Nursing note and vitals reviewed. Constitutional: He is oriented to person, place, and time. He appears well-developed and well-nourished.  HENT:  Head: Atraumatic.  Left Ear: There is swelling.  Ears:  There is a swollen tender area in the left ear canal that is consistent with an abscess.   Eyes: EOM are normal.  Neck: Neck supple.  Cardiovascular: Normal rate.   Pulmonary/Chest: Effort normal.  Musculoskeletal: Normal range of motion.  Neurological: He is alert and oriented to person, place, and time. No cranial nerve deficit.  Skin: Skin is warm and dry.  Psychiatric: He has a normal mood and affect. His behavior is normal.    ED Course  Procedures Auralgan ear drops to left ear. Incision made in the swollen area with 18 G needle and the area drained small amount of yellow drainage.   MDM: Dr. Adriana Simas in to examine the patient.    27 y.o. male with abscess to the left ear canal. Will treat with antibiotics and pain medication and he is to follow up with ENT. He is stable for discharge home without any immediate complications. He will return here as needed.  Discussed with the patient and all questioned fully answered.  Medication List    TAKE these medications       HYDROcodone-acetaminophen 5-325 MG per tablet  Commonly known as:  NORCO/VICODIN  Take 1 tablet by mouth every 4 (four) hours as needed.     sulfamethoxazole-trimethoprim 800-160 MG per tablet  Commonly known as:  SEPTRA DS  Take 1 tablet by mouth every 12 (twelve) hours.      ASK your doctor about these medications       ALEVE 220 MG tablet  Generic drug:  naproxen sodium  Take 440 mg by mouth daily as needed.     ibuprofen 200 MG tablet  Commonly known as:  ADVIL,MOTRIN  Take 800 mg by mouth every 6 (six) hours as needed. pain            Janne Napoleon, [place] 06/28/13 2330

## 2013-06-28 NOTE — ED Notes (Signed)
Patient complaining of left ear pain and states "I have a knot in my ear. I was treated about 1 month ago and given antibiotics and ear drops and it popped back up 2 weeks ago."

## 2013-07-03 NOTE — ED Provider Notes (Signed)
Medical screening examination/treatment/procedure(s) were conducted as a shared visit with non-physician practitioner(s) and myself.  I personally evaluated the patient during the encounter.  Abscess in left ear canal.  I and D not feasible.  Rx antibiotics. Referral to ENT.  Donnetta Hutching, MD 07/03/13 2237

## 2014-01-11 ENCOUNTER — Emergency Department (HOSPITAL_COMMUNITY)
Admission: EM | Admit: 2014-01-11 | Discharge: 2014-01-11 | Disposition: A | Discharge status: Home / Self Care | Payer: Self-pay

## 2014-01-11 NOTE — ED Notes (Signed)
No answer

## 2014-01-12 ENCOUNTER — Encounter (HOSPITAL_COMMUNITY): Payer: Self-pay | Admitting: Emergency Medicine

## 2014-01-12 ENCOUNTER — Emergency Department (HOSPITAL_COMMUNITY)
Admission: EM | Admit: 2014-01-12 | Discharge: 2014-01-12 | Disposition: A | Discharge status: Home / Self Care | Payer: Self-pay | Attending: Emergency Medicine | Admitting: Emergency Medicine

## 2014-01-12 DIAGNOSIS — H6000 Abscess of external ear, unspecified ear: Secondary | ICD-10-CM

## 2014-01-12 DIAGNOSIS — F17200 Nicotine dependence, unspecified, uncomplicated: Secondary | ICD-10-CM | POA: Insufficient documentation

## 2014-01-12 DIAGNOSIS — H8309 Labyrinthitis, unspecified ear: Secondary | ICD-10-CM | POA: Insufficient documentation

## 2014-01-12 DIAGNOSIS — Z79899 Other long term (current) drug therapy: Secondary | ICD-10-CM | POA: Insufficient documentation

## 2014-01-12 MED ORDER — SULFAMETHOXAZOLE-TRIMETHOPRIM 800-160 MG PO TABS: 1.0000 | ORAL_TABLET | Freq: Two times a day (BID) | ORAL | Status: DC

## 2014-01-12 NOTE — Discharge Instructions (Signed)
Abscess An abscess is an infected area that contains a collection of pus and debris.It can occur in almost any part of the body. An abscess is also known as a furuncle or boil. CAUSES  An abscess occurs when tissue gets infected. This can occur from blockage of oil or sweat glands, infection of hair follicles, or a minor injury to the skin. As the body tries to fight the infection, pus collects in the area and creates pressure under the skin. This pressure causes pain. People with weakened immune systems have difficulty fighting infections and get certain abscesses more often.  SYMPTOMS Usually an abscess develops on the skin and becomes a painful mass that is red, warm, and tender. If the abscess forms under the skin, you may feel a moveable soft area under the skin. Some abscesses break open (rupture) on their own, but most will continue to get worse without care. The infection can spread deeper into the body and eventually into the bloodstream, causing you to feel ill.  DIAGNOSIS  Your caregiver will take your medical history and perform a physical exam. A sample of fluid may also be taken from the abscess to determine what is causing your infection. TREATMENT  Your caregiver may prescribe antibiotic medicines to fight the infection. However, taking antibiotics alone usually does not cure an abscess. Your caregiver may need to make a small cut (incision) in the abscess to drain the pus. In some cases, gauze is packed into the abscess to reduce pain and to continue draining the area. HOME CARE INSTRUCTIONS   Only take over-the-counter or prescription medicines for pain, discomfort, or fever as directed by your caregiver.  If you were prescribed antibiotics, take them as directed. Finish them even if you start to feel better.  If gauze is used, follow your caregiver's directions for changing the gauze.  To avoid spreading the infection:  Keep your draining abscess covered with a  bandage.  Wash your hands well.  Do not share personal care items, towels, or whirlpools with others.  Avoid skin contact with others.  Keep your skin and clothes clean around the abscess.  Keep all follow-up appointments as directed by your caregiver. SEEK MEDICAL CARE IF:   You have increased pain, swelling, redness, fluid drainage, or bleeding.  You have muscle aches, chills, or a general ill feeling.  You have a fever. MAKE SURE YOU:   Understand these instructions.  Will watch your condition.  Will get help right away if you are not doing well or get worse. Document Released: 06/20/2005 Document Revised: 03/11/2012 Document Reviewed: 11/23/2011 [employer] Patient Information 2014 [employer].  

## 2014-01-12 NOTE — ED Notes (Signed)
Pt has recurring  "bumps" both ear canals. For 1 month.

## 2014-01-12 NOTE — ED Provider Notes (Signed)
CSN: [id number]     Arrival date & time 01/12/14  1114 History   First MD Initiated Contact with Patient 01/12/14 1141     Chief Complaint  Patient presents with  . Abscess    (Consider location/radiation/quality/duration/timing/severity/associated sxs/prior Treatment) Patient is a 28 y.o. male presenting with abscess.  Abscess Associated symptoms: no fever    Erik Branch is a 28 y.o. male with recurring infected "bumps" in his bilateral ear canals.  He was last seen here last summer for similar occurrence.  He reports slowly increasing tender swelling just inside his left ear canal and now has a small similar bump with tenderness inside the right ear canal as well.  This is complicated by the fact that he has to wear ear plugs at his job.  He denies fevers or chills, decreased hearing acuity and denies drainage from the ear canal.  He has found no alleviators.     History reviewed. No pertinent past medical history. Past Surgical History  Procedure Laterality Date  . Hernia repair     No family history on file. History  Substance Use Topics  . Smoking status: Current Every Day Smoker    Types: Cigarettes  . Smokeless tobacco: Not on file  . Alcohol Use: No    Review of Systems  Constitutional: Negative for fever and chills.  HENT: Positive for ear pain. Negative for ear discharge, facial swelling and hearing loss.   Respiratory: Negative for shortness of breath and wheezing.   Neurological: Negative for numbness.      Allergies  Review of patient's allergies indicates no known allergies.  Home Medications   Prior to Admission medications   Medication Sig Start Date End Date Taking? Authorizing Provider  fexofenadine (ALLEGRA) 180 MG tablet Take 180 mg by mouth daily.   Yes Historical Provider, MD  sulfamethoxazole-trimethoprim (SEPTRA DS) 800-160 MG per tablet Take 1 tablet by mouth every 12 (twelve) hours. 01/12/14   Burgess AmorJulie Teniya Filter, PA-C   BP 122/74  Pulse 66   Temp(Src) 98.3 F (36.8 C) (Oral)  Resp 18  Ht 5\' 7"  (1.702 m)  Wt 150 lb (68.04 kg)  BMI 23.49 kg/m2  SpO2 99% Physical Exam  Constitutional: He is oriented to person, place, and time. He appears well-developed and well-nourished.  HENT:  Head: Normocephalic.  Right Ear: There is tenderness. No swelling. No mastoid tenderness. No decreased hearing is noted.  Left Ear: There is swelling and tenderness. No mastoid tenderness. No decreased hearing is noted.  Approximate 0.5 cm raised fluctuant abscess just inside the left ear canal at the 9 oclock position.  No drainage,  No enlarged pore, pustule or any sign of tenting. Right ear canal is tender also but at the 3 oclock position,  But no swelling or fluctuance.  Cardiovascular: Normal rate.   Pulmonary/Chest: Effort normal.  Musculoskeletal: Normal range of motion.  Neurological: He is alert and oriented to person, place, and time. No sensory deficit.  Skin: Skin is warm and dry.    ED Course  Fine needle aspiration Date/Time: 01/12/2014 12:00 PM Performed by: Burgess AmorIDOL, Mariyanna Mucha Authorized by: Burgess AmorIDOL, Mimie Goering Consent: Verbal consent obtained. Risks and benefits: risks, benefits and alternatives were discussed Consent given by: patient Patient identity confirmed: verbally with patient Time out: Immediately prior to procedure a "time out" was called to verify the correct patient, procedure, equipment, support staff and site/side marked as required. Local anesthesia used: no Patient tolerance: Patient tolerated the procedure well with no immediate complications.  Comments: #21 needle used to needle aspirate a small amount of purulent fluid.  Gentle pressure applied using cotton swab and additional purulence was expresses.    (including critical care time) Labs Review Labs Reviewed - No data to display  Imaging Review No results found.   EKG Interpretation None      MDM   Final diagnoses:  Abscess of ear canal    Pt placed on  bactrim,  Encouraged f/u with Dr. Suszanne Connerseoh for further management since this is a recurrent problem,  Concern that this will continue to recur and he may need definitive surgical tx of this condition.  Pt understands plan.  Also advised to return here for any worsened sx including worse pain or fever.  The patient appears reasonably screened and/or stabilized for discharge and I doubt any other medical condition or other [facility] requiring further screening, evaluation, or treatment in the ED at this time prior to discharge.     Burgess AmorJulie Joon Pohle, PA-C 01/12/14 2130

## 2014-01-12 NOTE — ED Notes (Signed)
Pt reports has reoccurring bumps in ears for the past month.  Reports has had them before and went away with antibiotics.

## 2014-01-13 NOTE — ED Provider Notes (Signed)
Medical screening examination/treatment/procedure(s) were performed by non-physician practitioner and as supervising physician I was immediately available for consultation/collaboration.   EKG Interpretation None        Zakia Sainato M Cass Vandermeulen, DO 01/13/14 0713 

## 2015-03-22 ENCOUNTER — Encounter (HOSPITAL_COMMUNITY): Payer: Self-pay | Admitting: Emergency Medicine

## 2015-03-22 ENCOUNTER — Emergency Department (HOSPITAL_COMMUNITY)
Admission: EM | Admit: 2015-03-22 | Discharge: 2015-03-22 | Disposition: A | Discharge status: Home / Self Care | Payer: Medicaid Other | Attending: Emergency Medicine | Admitting: Emergency Medicine

## 2015-03-22 DIAGNOSIS — M545 Low back pain, unspecified: Secondary | ICD-10-CM

## 2015-03-22 DIAGNOSIS — Z79899 Other long term (current) drug therapy: Secondary | ICD-10-CM | POA: Insufficient documentation

## 2015-03-22 DIAGNOSIS — Z720 Tobacco use: Secondary | ICD-10-CM | POA: Insufficient documentation

## 2015-03-22 MED ORDER — OXYCODONE-ACETAMINOPHEN 5-325 MG PO TABS: 1.0000 | ORAL_TABLET | Freq: Four times a day (QID) | ORAL | Status: DC | PRN

## 2015-03-22 MED ORDER — OXYCODONE-ACETAMINOPHEN 5-325 MG PO TABS
1.0000 | ORAL_TABLET | Freq: Once | ORAL | Status: AC
  Administered 2015-03-22: 1 via ORAL
  Filled 2015-03-22: qty 1

## 2015-03-22 MED ORDER — KETOROLAC TROMETHAMINE 60 MG/2ML IM SOLN
60.0000 mg | Freq: Once | INTRAMUSCULAR | Status: AC
  Administered 2015-03-22: 60 mg via INTRAMUSCULAR
  Filled 2015-03-22: qty 2

## 2015-03-22 NOTE — Discharge Instructions (Signed)

## 2015-03-22 NOTE — ED Notes (Signed)
Pt c/o lower back pain x 3 weeks.  °

## 2015-03-22 NOTE — ED Provider Notes (Signed)
CSN: [id number]     Arrival date & time 03/22/15  0049 History   First MD Initiated Contact with Patient 03/22/15 [phone]     Chief Complaint  Patient presents with  . Back Pain     (Consider location/radiation/quality/duration/timing/severity/associated sxs/prior Treatment) HPI  This 29 year old male who presents with low back pain. Patient reports ongoing pain over the last 2-3 weeks. Denies any injury. Does report remote injury and occasional "exacerbation." He has taken ibuprofen at home with minimal relief. Denies any radiation of the pain.  Rates pain at 10 out of 10. Denies any weakness, numbness, tingling of the lower extremities. Denies any difficulty with his bowel or bladder. Saw his primary care physician earlier today and was given Flexeril and ibuprofen. He states that that is not helping.  History reviewed. No pertinent past medical history. Past Surgical History  Procedure Laterality Date  . Hernia repair     History reviewed. No pertinent family history. History  Substance Use Topics  . Smoking status: Current Every Day Smoker    Types: Cigarettes  . Smokeless tobacco: Not on file  . Alcohol Use: No    Review of Systems  Constitutional: Negative for fever.  Genitourinary: Negative for dysuria.  Musculoskeletal: Positive for back pain.  Neurological: Negative for weakness and numbness.  All other systems reviewed and are negative.     Allergies  Review of patient's allergies indicates no known allergies.  Home Medications   Prior to Admission medications   Medication Sig Start Date End Date Taking? Authorizing Provider  fexofenadine (ALLEGRA) 180 MG tablet Take 180 mg by mouth daily.    Historical Provider, MD  oxyCODONE-acetaminophen (PERCOCET/ROXICET) 5-325 MG per tablet Take 1 tablet by mouth every 6 (six) hours as needed for severe pain. 03/22/15   Shon Batonourtney F Paola Aleshire, MD  sulfamethoxazole-trimethoprim (SEPTRA DS) 800-160 MG per tablet Take 1 tablet by  mouth every 12 (twelve) hours. 01/12/14   Burgess AmorJulie Idol, PA-C   BP 112/72 mmHg  Pulse 77  Temp(Src) 98.1 F (36.7 C)  Resp 18  Ht 5\' 4"  (1.626 m)  Wt 174 lb (78.926 kg)  BMI 29.85 kg/m2  SpO2 97% Physical Exam  Constitutional: He is oriented to person, place, and time. He appears well-developed and well-nourished. No distress.  HENT:  Head: Normocephalic and atraumatic.  Cardiovascular: Normal rate and regular rhythm.   Pulmonary/Chest: Effort normal. No respiratory distress.  Musculoskeletal: He exhibits no edema.  No midline tenderness to palpation, step-off, or deformity, tenderness to palpation of the right paraspinous muscle region, negative straight leg raises  Neurological: He is alert and oriented to person, place, and time.  5 out of 5 strength bilateral lower extremities, normal reflexes  Skin: Skin is warm and dry.  Psychiatric: He has a normal mood and affect.  Nursing note and vitals reviewed.   ED Course  Procedures (including critical care time) Labs Review Labs Reviewed - No data to display  Imaging Review No results found.   EKG Interpretation None      MDM   Final diagnoses:  Lumbosacral pain    Patient presents with lumbosacral pain. History of similar symptoms in the past and remote injury. Nontoxic on exam. No red flags. No signs or symptoms of cauda equina. Patient given Toradol and Percocet. Will be discharged with a very short course of Percocet and is to follow-up with his primary physician for further pain medications.  After history, exam, and medical workup I feel the patient has been  appropriately medically screened and is safe for discharge home. Pertinent diagnoses were discussed with the patient. Patient was given return precautions.     Shon Baton, MD 03/22/15 [phone]

## 2018-03-02 ENCOUNTER — Encounter (HOSPITAL_COMMUNITY): Payer: Self-pay | Admitting: Emergency Medicine

## 2018-03-02 ENCOUNTER — Emergency Department (HOSPITAL_COMMUNITY)
Admission: EM | Admit: 2018-03-02 | Discharge: 2018-03-02 | Disposition: A | Discharge status: Home / Self Care | Payer: Self-pay | Attending: Emergency Medicine | Admitting: Emergency Medicine

## 2018-03-02 ENCOUNTER — Other Ambulatory Visit: Payer: Self-pay

## 2018-03-02 DIAGNOSIS — K0889 Other specified disorders of teeth and supporting structures: Secondary | ICD-10-CM

## 2018-03-02 DIAGNOSIS — K029 Dental caries, unspecified: Secondary | ICD-10-CM | POA: Insufficient documentation

## 2018-03-02 DIAGNOSIS — F17210 Nicotine dependence, cigarettes, uncomplicated: Secondary | ICD-10-CM | POA: Insufficient documentation

## 2018-03-02 DIAGNOSIS — Z79899 Other long term (current) drug therapy: Secondary | ICD-10-CM | POA: Insufficient documentation

## 2018-03-02 MED ORDER — DICLOFENAC SODIUM 75 MG PO TBEC: 75.0000 mg | DELAYED_RELEASE_TABLET | Freq: Two times a day (BID) | ORAL | 0 refills | Status: DC

## 2018-03-02 MED ORDER — HYDROCODONE-ACETAMINOPHEN 5-325 MG PO TABS
1.0000 | ORAL_TABLET | Freq: Once | ORAL | Status: AC
  Administered 2018-03-02: 1 via ORAL
  Filled 2018-03-02: qty 1

## 2018-03-02 MED ORDER — CLINDAMYCIN HCL 150 MG PO CAPS
300.0000 mg | ORAL_CAPSULE | Freq: Once | ORAL | Status: AC
  Administered 2018-03-02: 300 mg via ORAL
  Filled 2018-03-02: qty 2

## 2018-03-02 MED ORDER — CLINDAMYCIN HCL 150 MG PO CAPS: 300.0000 mg | ORAL_CAPSULE | Freq: Four times a day (QID) | ORAL | 0 refills | Status: DC

## 2018-03-02 NOTE — ED Notes (Signed)
Bad tooth for awhile per pt  Large caries to premolar on bottom of R jaw Missing back molars  Pt is a smoker

## 2018-03-02 NOTE — ED Triage Notes (Signed)
Pt c/o lower right sided dental pain x one week.

## 2018-03-02 NOTE — Discharge Instructions (Addendum)
You may use Orajel as directed.  Follow-up with your dentist this week.

## 2018-03-02 NOTE — ED Provider Notes (Signed)
[facility] EMERGENCY DEPARTMENT Provider Note   CSN: [id number] Arrival date & time: 03/02/18  2057     History   Chief Complaint Chief Complaint  Patient presents with  . Dental Pain    HPI Erik Branch is a 32 y.o. male.  HPI   Erik Branch is a 32 y.o. male who presents to the Emergency Department complaining of right lower dental pain that has been worsening for 1 week.  He reports a constant throbbing pain to his right face today.  He states initially, the pain was associated with cold or hot foods or liquids, now he states the pain is constant.  Pain radiates from his chin through his jaw.  He has not contacted his dentist.  He denies facial swelling, difficulty swallowing or breathing, fever, or chills.  He is tried over-the-counter pain relievers without relief   History reviewed. No pertinent past medical history.  There are no active problems to display for this patient.   Past Surgical History:  Procedure Laterality Date  . HERNIA REPAIR        Home Medications    Prior to Admission medications   Medication Sig Start Date End Date Taking? Authorizing Provider  clindamycin (CLEOCIN) 150 MG capsule Take 2 capsules (300 mg total) by mouth 4 (four) times daily. For 7 days 03/02/18   Pauline Ausriplett, Nikkie Liming, PA-C  diclofenac (VOLTAREN) 75 MG EC tablet Take 1 tablet (75 mg total) by mouth 2 (two) times daily. Take with food 03/02/18   Dimitrious Micciche, PA-C  fexofenadine (ALLEGRA) 180 MG tablet Take 180 mg by mouth daily.    [provider]  oxyCODONE-acetaminophen (PERCOCET/ROXICET) 5-325 MG per tablet Take 1 tablet by mouth every 6 (six) hours as needed for severe pain. 03/22/15   Horton, Mayer Maskerourtney F, MD  sulfamethoxazole-trimethoprim (SEPTRA DS) 800-160 MG per tablet Take 1 tablet by mouth every 12 (twelve) hours. 01/12/14   Burgess AmorIdol, Julie, PA-C    Family History No family history on file.  Social History Social History   Tobacco Use  . Smoking status:  Current Every Day Smoker    Types: Cigarettes  . Smokeless tobacco: Never Used  Substance Use Topics  . Alcohol use: No  . Drug use: Yes    Types: Marijuana     Allergies   Patient has no known allergies.   Review of Systems Review of Systems  Constitutional: Negative for appetite change and fever.  HENT: Positive for dental problem. Negative for congestion, facial swelling, sore throat and trouble swallowing.   Eyes: Negative for pain and visual disturbance.  Musculoskeletal: Negative for neck pain and neck stiffness.  Neurological: Negative for dizziness, facial asymmetry and headaches.  Hematological: Negative for adenopathy.  All other systems reviewed and are negative.    Physical Exam Updated Vital Signs BP 139/88 (BP Location: Right Arm)   Pulse (!) 56   Temp 99.6 F (37.6 C) (Oral)   Resp 18   Ht 5\' 9"  (1.753 m)   Wt 66.7 kg (147 lb)   SpO2 98%   BMI 21.71 kg/m   Physical Exam  Constitutional: He is oriented to person, place, and time. He appears well-developed and well-nourished. No distress.  HENT:  Head: Normocephalic and atraumatic.  Right Ear: Tympanic membrane and ear canal normal.  Left Ear: Tympanic membrane and ear canal normal.  Mouth/Throat: Uvula is midline, oropharynx is clear and moist and mucous membranes are normal. No trismus in the jaw. Dental caries present. No  dental abscesses or uvula swelling.  Tenderness and dental caries of the right lower second premolar.  Mild fluctuance of the surrounding gingiva without obvious abscess, facial swelling, trismus, or sublingual abnml.    Neck: Normal range of motion. Neck supple.  Cardiovascular: Normal rate, regular rhythm and normal heart sounds.  No murmur heard. Pulmonary/Chest: Effort normal and breath sounds normal.  Musculoskeletal: Normal range of motion.  Lymphadenopathy:    He has no cervical adenopathy.  Neurological: He is alert and oriented to person, place, and time. He exhibits  normal muscle tone. Coordination normal.  Skin: Skin is warm and dry.  Nursing note and vitals reviewed.    ED Treatments / Results  Labs (all labs ordered are listed, but only abnormal results are displayed) Labs Reviewed - No data to display  EKG None  Radiology No results found.  Procedures Procedures (including critical care time)  Medications Ordered in ED Medications  clindamycin (CLEOCIN) capsule 300 mg (has no administration in time range)  HYDROcodone-acetaminophen (NORCO/VICODIN) 5-325 MG per tablet 1 tablet (has no administration in time range)     Initial Impression / Assessment and Plan / ED Course  I have reviewed the triage vital signs and the nursing notes.  Pertinent labs & imaging results that were available during my care of the patient were reviewed by me and considered in my medical decision making (see chart for details).     Patient well-appearing, vitals reviewed.  No concerning symptoms for Ludewig's angina or drainable dental abscess.  Patient states he will follow-up with his dentist this week.  Will treat with anti-inflammatory and clindamycin.  Patient agrees to plan.  Final Clinical Impressions(s) / ED Diagnoses   Final diagnoses:  Pain, dental    ED Discharge Orders        Ordered    clindamycin (CLEOCIN) 150 MG capsule  4 times daily     03/02/18 2115    diclofenac (VOLTAREN) 75 MG EC tablet  2 times daily     03/02/18 2115       Pauline Aus, PA-C 03/02/18 2121    Bethann Berkshire, MD 03/02/18 2337

## 2018-03-28 ENCOUNTER — Other Ambulatory Visit: Payer: Self-pay

## 2018-03-28 ENCOUNTER — Emergency Department (HOSPITAL_COMMUNITY)
Admission: EM | Admit: 2018-03-28 | Discharge: 2018-03-28 | Disposition: A | Discharge status: Home / Self Care | Payer: Self-pay | Attending: Emergency Medicine | Admitting: Emergency Medicine

## 2018-03-28 ENCOUNTER — Encounter (HOSPITAL_COMMUNITY): Payer: Self-pay | Admitting: *Deleted

## 2018-03-28 DIAGNOSIS — K047 Periapical abscess without sinus: Secondary | ICD-10-CM | POA: Insufficient documentation

## 2018-03-28 DIAGNOSIS — R590 Localized enlarged lymph nodes: Secondary | ICD-10-CM | POA: Insufficient documentation

## 2018-03-28 DIAGNOSIS — F17210 Nicotine dependence, cigarettes, uncomplicated: Secondary | ICD-10-CM | POA: Insufficient documentation

## 2018-03-28 DIAGNOSIS — Z79899 Other long term (current) drug therapy: Secondary | ICD-10-CM | POA: Insufficient documentation

## 2018-03-28 DIAGNOSIS — K029 Dental caries, unspecified: Secondary | ICD-10-CM | POA: Insufficient documentation

## 2018-03-28 MED ORDER — AMOXICILLIN 500 MG PO CAPS: 500.0000 mg | ORAL_CAPSULE | Freq: Three times a day (TID) | ORAL | 0 refills | Status: AC

## 2018-03-28 MED ORDER — IBUPROFEN 800 MG PO TABS
800.0000 mg | ORAL_TABLET | Freq: Once | ORAL | Status: AC
  Administered 2018-03-28: 800 mg via ORAL
  Filled 2018-03-28: qty 1

## 2018-03-28 MED ORDER — AMOXICILLIN 250 MG PO CAPS
500.0000 mg | ORAL_CAPSULE | Freq: Once | ORAL | Status: AC
  Administered 2018-03-28: 500 mg via ORAL
  Filled 2018-03-28: qty 2

## 2018-03-28 MED ORDER — IBUPROFEN 800 MG PO TABS: 800.0000 mg | ORAL_TABLET | Freq: Three times a day (TID) | ORAL | 0 refills | Status: DC

## 2018-03-28 MED ORDER — TRAMADOL HCL 50 MG PO TABS: 50.0000 mg | ORAL_TABLET | Freq: Four times a day (QID) | ORAL | 0 refills | Status: DC | PRN

## 2018-03-28 NOTE — ED Notes (Signed)
Pt reports toothache for the last month Had a dentist appt but did n ot go  Reports he has a dentist appt on Monday  His R lower first molar has large caries with depth below jaw Reports no insurance  Given free clinic information as well as /good Rx card

## 2018-03-28 NOTE — Discharge Instructions (Addendum)
Complete your entire course of antibiotics as prescribed.  You  may use the tramadol for pain relief for the first day, but after you have been on the antibiotic for a day, your pain should be well controlled with ibuprofen.  Do not drive within 4 hours of taking as tramadol as it will make you drowsy.  Avoid applying heat or ice to this abscess area which can worsen your symptoms.  You may use warm salt water swish and spit treatment or half peroxide and water swish and spit after meals to keep this area clean.

## 2018-03-28 NOTE — ED Triage Notes (Signed)
Pt with dental pain started Thursday.  Pt states dentist appt on Monday.

## 2018-03-29 NOTE — ED Provider Notes (Signed)
[facility] Provider Note   CSN: [id number] Arrival date & time: 03/28/18  2009     History   Chief Complaint Chief Complaint  Patient presents with  . Dental Pain    HPI Erik Branch is a 32 y.o. male presenting with a one day history of dental pain and gingival swelling.   The patient has a history of decay in the tooth involved which has recently started to cause increased  pain.  There has been no fevers, chills, nausea or vomiting, also no complaint of difficulty swallowing, although chewing makes pain worse.  The patient has had no medications prior to arrival.  He is planning to see his dentist in 3 days but is concerned about possible infection.   .  The history is provided by the patient.    History reviewed. No pertinent past medical history.  There are no active problems to display for this patient.   Past Surgical History:  Procedure Laterality Date  . HERNIA REPAIR          Home Medications    Prior to Admission medications   Medication Sig Start Date End Date Taking? Authorizing Provider  amoxicillin (AMOXIL) 500 MG capsule Take 1 capsule (500 mg total) by mouth 3 (three) times daily for 10 days. 03/28/18 04/07/18  Burgess AmorIdol, Ernestene Coover, PA-C  clindamycin (CLEOCIN) 150 MG capsule Take 2 capsules (300 mg total) by mouth 4 (four) times daily. For 7 days 03/02/18   Pauline Ausriplett, Tammy, PA-C  diclofenac (VOLTAREN) 75 MG EC tablet Take 1 tablet (75 mg total) by mouth 2 (two) times daily. Take with food 03/02/18   Triplett, Tammy, PA-C  fexofenadine (ALLEGRA) 180 MG tablet Take 180 mg by mouth daily.    [provider]  ibuprofen (ADVIL,MOTRIN) 800 MG tablet Take 1 tablet (800 mg total) by mouth 3 (three) times daily. 03/28/18   Burgess AmorIdol, Darrell Leonhardt, PA-C  oxyCODONE-acetaminophen (PERCOCET/ROXICET) 5-325 MG per tablet Take 1 tablet by mouth every 6 (six) hours as needed for severe pain. 03/22/15   Horton, Mayer Maskerourtney F, MD  sulfamethoxazole-trimethoprim (SEPTRA DS)  800-160 MG per tablet Take 1 tablet by mouth every 12 (twelve) hours. 01/12/14   Burgess AmorIdol, Sya Nestler, PA-C  traMADol (ULTRAM) 50 MG tablet Take 1 tablet (50 mg total) by mouth every 6 (six) hours as needed. 03/28/18   Burgess AmorIdol, Yanelli Zapanta, PA-C    Family History History reviewed. No pertinent family history.  Social History Social History   Tobacco Use  . Smoking status: Current Every Day Smoker    Types: Cigarettes  . Smokeless tobacco: Never Used  Substance Use Topics  . Alcohol use: No  . Drug use: Yes    Types: Marijuana     Allergies   Patient has no known allergies.   Review of Systems Review of Systems  Constitutional: Negative for fever.  HENT: Positive for dental problem. Negative for facial swelling and sore throat.   Respiratory: Negative for shortness of breath.   Musculoskeletal: Negative for neck pain and neck stiffness.     Physical Exam Updated Vital Signs BP (!) 160/93 (BP Location: Right Arm)   Pulse (!) 50   Temp 98.6 F (37 C) (Oral)   Resp 15   Ht 5\' 6"  (1.676 m)   Wt 67.1 kg (148 lb)   SpO2 99%   BMI 23.89 kg/m   Physical Exam  Constitutional: He is oriented to person, place, and time. He appears well-developed and well-nourished. No distress.  HENT:  Head: Normocephalic and atraumatic.  Right Ear: Tympanic membrane and external ear normal.  Left Ear: Tympanic membrane and external ear normal.  Mouth/Throat: Oropharynx is clear and moist and mucous membranes are normal. No oral lesions. No trismus in the jaw. Dental abscesses and dental caries present. No uvula swelling.  Multiple areas of dental decay. Gingival edema along right lower gumline.  Sublingual space is soft.  Eyes: Conjunctivae are normal.  Neck: Normal range of motion. Neck supple.  Cardiovascular: Normal rate and normal heart sounds.  Pulmonary/Chest: Effort normal.  Musculoskeletal: Normal range of motion.  Lymphadenopathy:       Head (right side): Submandibular adenopathy present.    He  has no cervical adenopathy.  Neurological: He is alert and oriented to person, place, and time.  Skin: Skin is warm and dry. No erythema.  Psychiatric: He has a normal mood and affect.     ED Treatments / Results  Labs (all labs ordered are listed, but only abnormal results are displayed) Labs Reviewed - No data to display  EKG None  Radiology No results found.  Procedures Procedures (including critical care time)  Medications Ordered in ED Medications  amoxicillin (AMOXIL) capsule 500 mg (500 mg Oral Given 03/28/18 2139)  ibuprofen (ADVIL,MOTRIN) tablet 800 mg (800 mg Oral Given 03/28/18 2139)     Initial Impression / Assessment and Plan / ED Course  I have reviewed the triage vital signs and the nursing notes.  Pertinent labs & imaging results that were available during my care of the patient were reviewed by me and considered in my medical decision making (see chart for details).     Pt placed on amoxil, ibuprofen.  Few tramadol prescribed after review of [place] narcotic database. No drainable dental abscess, no signs of ludwigs angina.  Plan f/u with dentistry in 3 days as planned.  Final Clinical Impressions(s) / ED Diagnoses   Final diagnoses:  Infected dental caries  Lymphadenopathy, submandibular    ED Discharge Orders        Ordered    amoxicillin (AMOXIL) 500 MG capsule  3 times daily     03/28/18 2131    ibuprofen (ADVIL,MOTRIN) 800 MG tablet  3 times daily     03/28/18 2131    traMADol (ULTRAM) 50 MG tablet  Every 6 hours PRN     03/28/18 2131       Burgess Amor, PA-C 03/29/18 1741    Bethann Berkshire, MD 03/30/18 [phone]

## 2019-07-20 ENCOUNTER — Encounter (HOSPITAL_COMMUNITY): Payer: Self-pay

## 2019-07-20 ENCOUNTER — Emergency Department (HOSPITAL_COMMUNITY)
Admission: EM | Admit: 2019-07-20 | Discharge: 2019-07-20 | Disposition: A | Discharge status: Home / Self Care | Payer: Self-pay | Attending: Emergency Medicine | Admitting: Emergency Medicine

## 2019-07-20 ENCOUNTER — Other Ambulatory Visit: Payer: Self-pay

## 2019-07-20 DIAGNOSIS — Y9259 Other trade areas as the place of occurrence of the external cause: Secondary | ICD-10-CM | POA: Insufficient documentation

## 2019-07-20 DIAGNOSIS — F17210 Nicotine dependence, cigarettes, uncomplicated: Secondary | ICD-10-CM | POA: Insufficient documentation

## 2019-07-20 DIAGNOSIS — S39012A Strain of muscle, fascia and tendon of lower back, initial encounter: Secondary | ICD-10-CM | POA: Insufficient documentation

## 2019-07-20 DIAGNOSIS — X500XXA Overexertion from strenuous movement or load, initial encounter: Secondary | ICD-10-CM | POA: Insufficient documentation

## 2019-07-20 DIAGNOSIS — Y939 Activity, unspecified: Secondary | ICD-10-CM | POA: Insufficient documentation

## 2019-07-20 DIAGNOSIS — Y990 Civilian activity done for income or pay: Secondary | ICD-10-CM | POA: Insufficient documentation

## 2019-07-20 DIAGNOSIS — Z79899 Other long term (current) drug therapy: Secondary | ICD-10-CM | POA: Insufficient documentation

## 2019-07-20 MED ORDER — KETOROLAC TROMETHAMINE 60 MG/2ML IM SOLN
60.0000 mg | Freq: Once | INTRAMUSCULAR | Status: AC
  Administered 2019-07-20: 13:00:00 60 mg via INTRAMUSCULAR
  Filled 2019-07-20: qty 2

## 2019-07-20 MED ORDER — DICLOFENAC SODIUM 75 MG PO TBEC: 75.0000 mg | DELAYED_RELEASE_TABLET | Freq: Two times a day (BID) | ORAL | 0 refills | Status: DC

## 2019-07-20 MED ORDER — CYCLOBENZAPRINE HCL 10 MG PO TABS: 10.0000 mg | ORAL_TABLET | Freq: Three times a day (TID) | ORAL | 0 refills | Status: DC | PRN

## 2019-07-20 NOTE — ED Provider Notes (Signed)
[facility] EMERGENCY DEPARTMENT Provider Note   CSN: [id number] Arrival date & time: 07/20/19  1153     History   Chief Complaint Chief Complaint  Patient presents with  . Back Pain    HPI Erik Branch is a 33 y.o. male.     HPI   Erik Branch is a 33 y.o. male who presents to the Emergency Department complaining of diffuse lower back pain since yesterday.  Today, he states he woke with worsening pain to both sides of his lower back that is associated with certain movements.  Pain improves at rest.  He denies known injury, but states that he performs heavy lifting and lots of physical activity at his job.  He took ibuprofen this morning without relief.  He denies abdominal pain, fever, chills, pain numbness or weakness of his lower extremities, urine or bowel changes.  He states that he has occasional "flare ups" of back pain and states the current pain feels similar to previous.  History reviewed. No pertinent past medical history.  There are no active problems to display for this patient.   Past Surgical History:  Procedure Laterality Date  . HERNIA REPAIR        Home Medications    Prior to Admission medications   Medication Sig Start Date End Date Taking? Authorizing Provider  clindamycin (CLEOCIN) 150 MG capsule Take 2 capsules (300 mg total) by mouth 4 (four) times daily. For 7 days 03/02/18   Pauline Aus, PA-C  diclofenac (VOLTAREN) 75 MG EC tablet Take 1 tablet (75 mg total) by mouth 2 (two) times daily. Take with food 03/02/18   Valda Christenson, PA-C  fexofenadine (ALLEGRA) 180 MG tablet Take 180 mg by mouth daily.    [provider]  ibuprofen (ADVIL,MOTRIN) 800 MG tablet Take 1 tablet (800 mg total) by mouth 3 (three) times daily. 03/28/18   Burgess Amor, PA-C  oxyCODONE-acetaminophen (PERCOCET/ROXICET) 5-325 MG per tablet Take 1 tablet by mouth every 6 (six) hours as needed for severe pain. 03/22/15   Horton, Mayer Masker, MD   sulfamethoxazole-trimethoprim (SEPTRA DS) 800-160 MG per tablet Take 1 tablet by mouth every 12 (twelve) hours. 01/12/14   Burgess Amor, PA-C  traMADol (ULTRAM) 50 MG tablet Take 1 tablet (50 mg total) by mouth every 6 (six) hours as needed. 03/28/18   Burgess Amor, PA-C    Family History No family history on file.  Social History Social History   Tobacco Use  . Smoking status: Current Every Day Smoker    Types: Cigarettes  . Smokeless tobacco: Never Used  Substance Use Topics  . Alcohol use: No  . Drug use: Yes    Types: Marijuana     Allergies   Patient has no known allergies.   Review of Systems Review of Systems  Constitutional: Negative for fever.  Respiratory: Negative for chest tightness and shortness of breath.   Cardiovascular: Negative for chest pain.  Gastrointestinal: Negative for abdominal pain, constipation and vomiting.  Genitourinary: Negative for decreased urine volume, difficulty urinating, dysuria and flank pain.  Musculoskeletal: Positive for back pain. Negative for joint swelling.  Skin: Negative for rash.  Neurological: Negative for weakness and numbness.     Physical Exam Updated Vital Signs BP 115/67 (BP Location: Left Arm)   Pulse 74   Temp 98.3 F (36.8 C) (Oral)   Resp 18   Ht 5\' 6"  (1.676 m)   Wt 71.7 kg   SpO2 96%   BMI 25.50  kg/m   Physical Exam Vitals signs and nursing note reviewed.  Constitutional:      General: He is not in acute distress.    Appearance: Normal appearance. He is well-developed.  HENT:     Head: Atraumatic.  Neck:     Musculoskeletal: Normal range of motion.  Cardiovascular:     Rate and Rhythm: Normal rate and regular rhythm.     Pulses: Normal pulses.     Comments: DP pulses are strong and palpable bilaterally Pulmonary:     Effort: Pulmonary effort is normal. No respiratory distress.     Breath sounds: Normal breath sounds.  Chest:     Chest wall: No tenderness.  Abdominal:     General: There is no  distension.     Palpations: Abdomen is soft.     Tenderness: There is no abdominal tenderness.  Musculoskeletal:        General: Tenderness present. No swelling.     Lumbar back: He exhibits tenderness and pain. He exhibits normal range of motion, no swelling, no deformity, no laceration and normal pulse.     Comments: ttp of the bilateral lumbar paraspinal muscles.  No midline tenderness.  Pt has 5/5 strength against resistance of bilateral lower extremities.  Hip flexors and extensors are intact   Skin:    General: Skin is warm.     Capillary Refill: Capillary refill takes less than 2 seconds.     Findings: No rash.  Neurological:     Mental Status: He is alert and oriented to person, place, and time.     Sensory: No sensory deficit.     Motor: No abnormal muscle tone.     Coordination: Coordination normal.     Gait: Gait normal.     Deep Tendon Reflexes:     Reflex Scores:      Patellar reflexes are 2+ on the right side and 2+ on the left side.      Achilles reflexes are 2+ on the right side and 2+ on the left side.     ED Treatments / Results  Labs (all labs ordered are listed, but only abnormal results are displayed) Labs Reviewed - No data to display  EKG None  Radiology No results found.  Procedures Procedures (including critical care time)  Medications Ordered in ED Medications  ketorolac (TORADOL) injection 60 mg (has no administration in time range)     Initial Impression / Assessment and Plan / ED Course  I have reviewed the triage vital signs and the nursing notes.  Pertinent labs & imaging results that were available during my care of the patient were reviewed by me and considered in my medical decision making (see chart for details).        Patient with likely musculoskeletal pain.  No concerning symptoms for cauda equina or infectious process.  Patient is ambulatory, gait is steady.  No focal neuro deficits on exam.  Will treat with  anti-inflammatory and muscle relaxer.  Patient appears appropriate for discharge home he agrees to close outpatient follow-up.  Return precautions discussed.  Final Clinical Impressions(s) / ED Diagnoses   Final diagnoses:  Strain of lumbar region, initial encounter    ED Discharge Orders    None       Kem Parkinson, Hershal Coria 07/20/19 1317    Nat Christen, MD 07/21/19 0730

## 2019-07-20 NOTE — Discharge Instructions (Signed)
Alternate ice and heat to your lower back.  Avoid heavy lifting or twisting movements for at least 1 week.  Follow-up with your primary doctor or return to the ER for any worsening symptoms.

## 2019-07-20 NOTE — ED Triage Notes (Signed)
Pt reports that he woke up his lower back hurting . No injury reported. Pt reports he took motrin

## 2019-09-12 ENCOUNTER — Other Ambulatory Visit: Payer: Self-pay

## 2019-09-12 ENCOUNTER — Emergency Department (HOSPITAL_COMMUNITY)
Admission: EM | Admit: 2019-09-12 | Discharge: 2019-09-12 | Disposition: A | Discharge status: Home / Self Care | Attending: Emergency Medicine | Admitting: Emergency Medicine

## 2019-09-12 ENCOUNTER — Emergency Department (HOSPITAL_COMMUNITY)

## 2019-09-12 ENCOUNTER — Encounter (HOSPITAL_COMMUNITY): Payer: Self-pay | Admitting: Emergency Medicine

## 2019-09-12 DIAGNOSIS — S61309A Unspecified open wound of unspecified finger with damage to nail, initial encounter: Secondary | ICD-10-CM

## 2019-09-12 DIAGNOSIS — S62639B Displaced fracture of distal phalanx of unspecified finger, initial encounter for open fracture: Secondary | ICD-10-CM | POA: Diagnosis not present

## 2019-09-12 DIAGNOSIS — Y9389 Activity, other specified: Secondary | ICD-10-CM | POA: Insufficient documentation

## 2019-09-12 DIAGNOSIS — W231XXA Caught, crushed, jammed, or pinched between stationary objects, initial encounter: Secondary | ICD-10-CM | POA: Diagnosis not present

## 2019-09-12 DIAGNOSIS — Y998 Other external cause status: Secondary | ICD-10-CM | POA: Insufficient documentation

## 2019-09-12 DIAGNOSIS — Z23 Encounter for immunization: Secondary | ICD-10-CM | POA: Diagnosis not present

## 2019-09-12 DIAGNOSIS — Z79899 Other long term (current) drug therapy: Secondary | ICD-10-CM | POA: Diagnosis not present

## 2019-09-12 DIAGNOSIS — Y9289 Other specified places as the place of occurrence of the external cause: Secondary | ICD-10-CM | POA: Insufficient documentation

## 2019-09-12 DIAGNOSIS — F1210 Cannabis abuse, uncomplicated: Secondary | ICD-10-CM | POA: Insufficient documentation

## 2019-09-12 DIAGNOSIS — F17210 Nicotine dependence, cigarettes, uncomplicated: Secondary | ICD-10-CM | POA: Diagnosis not present

## 2019-09-12 DIAGNOSIS — S6992XA Unspecified injury of left wrist, hand and finger(s), initial encounter: Secondary | ICD-10-CM | POA: Diagnosis present

## 2019-09-12 MED ORDER — DOXYCYCLINE HYCLATE 100 MG PO CAPS: 100.0000 mg | ORAL_CAPSULE | Freq: Two times a day (BID) | ORAL | 0 refills | Status: DC

## 2019-09-12 MED ORDER — HYDROCODONE-ACETAMINOPHEN 5-325 MG PO TABS: 1.0000 | ORAL_TABLET | ORAL | 0 refills | Status: DC | PRN

## 2019-09-12 MED ORDER — TETANUS-DIPHTH-ACELL PERTUSSIS 5-2.5-18.5 LF-MCG/0.5 IM SUSP
0.5000 mL | Freq: Once | INTRAMUSCULAR | Status: AC
  Administered 2019-09-12: 0.5 mL via INTRAMUSCULAR
  Filled 2019-09-12: qty 0.5

## 2019-09-12 MED ORDER — LIDOCAINE HCL (PF) 1 % IJ SOLN
30.0000 mL | Freq: Once | INTRAMUSCULAR | Status: AC
  Administered 2019-09-12: 07:00:00 30 mL
  Filled 2019-09-12: qty 30

## 2019-09-12 MED ORDER — HYDROCODONE-ACETAMINOPHEN 5-325 MG PO TABS
1.0000 | ORAL_TABLET | Freq: Once | ORAL | Status: AC
  Administered 2019-09-12: 1 via ORAL
  Filled 2019-09-12: qty 1

## 2019-09-12 NOTE — ED Provider Notes (Addendum)
[facility] EMERGENCY DEPARTMENT Provider Note   CSN: [id number] Arrival date & time: 09/12/19  0414     History Chief Complaint  Patient presents with  . Laceration    Erik Branch is a 33 y.o. male.  Patient presents to the emergency department for evaluation of injury to left index finger.  Patient smashed the finger while at work tonight.  He reports that the finger nail was ripped off and he has a laceration.  He is unable to stop the bleeding.  Tetanus is not up-to-date.        History reviewed. No pertinent past medical history.  There are no problems to display for this patient.   Past Surgical History:  Procedure Laterality Date  . HERNIA REPAIR         No family history on file.  Social History   Tobacco Use  . Smoking status: Current Every Day Smoker    Types: Cigarettes  . Smokeless tobacco: Never Used  Substance Use Topics  . Alcohol use: No  . Drug use: Yes    Types: Marijuana    Home Medications Prior to Admission medications   Medication Sig Start Date End Date Taking? Authorizing Provider  clindamycin (CLEOCIN) 150 MG capsule Take 2 capsules (300 mg total) by mouth 4 (four) times daily. For 7 days 03/02/18   Triplett, Tammy, PA-C  cyclobenzaprine (FLEXERIL) 10 MG tablet Take 1 tablet (10 mg total) by mouth 3 (three) times daily as needed. 07/20/19   Triplett, Tammy, PA-C  diclofenac (VOLTAREN) 75 MG EC tablet Take 1 tablet (75 mg total) by mouth 2 (two) times daily. Take with food 07/20/19   Triplett, Tammy, PA-C  doxycycline (VIBRAMYCIN) 100 MG capsule Take 1 capsule (100 mg total) by mouth 2 (two) times daily. 09/12/19   Orpah Greek, MD  fexofenadine (ALLEGRA) 180 MG tablet Take 180 mg by mouth daily.    [provider]  HYDROcodone-acetaminophen (NORCO/VICODIN) 5-325 MG tablet Take 1 tablet by mouth every 4 (four) hours as needed for moderate pain. 09/12/19   Orpah Greek, MD  ibuprofen (ADVIL,MOTRIN) 800 MG  tablet Take 1 tablet (800 mg total) by mouth 3 (three) times daily. 03/28/18   Evalee Jefferson, PA-C  oxyCODONE-acetaminophen (PERCOCET/ROXICET) 5-325 MG per tablet Take 1 tablet by mouth every 6 (six) hours as needed for severe pain. 03/22/15   Horton, Barbette Hair, MD  sulfamethoxazole-trimethoprim (SEPTRA DS) 800-160 MG per tablet Take 1 tablet by mouth every 12 (twelve) hours. 01/12/14   Evalee Jefferson, PA-C  traMADol (ULTRAM) 50 MG tablet Take 1 tablet (50 mg total) by mouth every 6 (six) hours as needed. 03/28/18   Evalee Jefferson, PA-C    Allergies    Patient has no known allergies.  Review of Systems   Review of Systems  Skin: Positive for wound.    Physical Exam Updated Vital Signs BP 134/79 (BP Location: Right Arm)   Pulse 96   Temp 98.2 F (36.8 C) (Oral)   Resp 14   Ht 5\' 4"  (1.626 m)   Wt 70.8 kg   SpO2 100%   BMI 26.78 kg/m   Physical Exam Vitals and nursing note reviewed.  Constitutional:      Appearance: Normal appearance.  HENT:     Head: Atraumatic.  Musculoskeletal:     Left hand: Laceration (Across middle portion of nail bed of second finger with extension beyond the nailbed to ulnar aspect of fingertip) and tenderness (Left index finger) present.  Skin:    Findings: Laceration present.  Neurological:     Mental Status: He is alert.     Sensory: Sensation is intact.     Motor: Motor function is intact.     ED Results / Procedures / Treatments   Labs (all labs ordered are listed, but only abnormal results are displayed) Labs Reviewed - No data to display  EKG None  Radiology DG Finger Index Left  Result Date: 09/12/2019 CLINICAL DATA:  Left index finger pain after injury, smashed finger. EXAM: LEFT INDEX FINGER 2+V COMPARISON:  None. FINDINGS: Displaced distal tuft fracture of the index finger. No intra-articular extension. The alignment is maintained. Mild associated soft tissue irregularity. No tracking soft tissue air or radiopaque foreign body. IMPRESSION:  Displaced distal tuft fracture of the index finger. Electronically Signed   By: Narda Rutherford M.D.   On: 09/12/2019 05:35    Procedures .Marland KitchenLaceration Repair  Date/Time: 09/23/2019 2:41 AM Performed by: Gilda Crease, MD Authorized by: Gilda Crease, MD   Consent:    Consent obtained:  Verbal   Consent given by:  Patient   Risks discussed:  Infection, pain and poor cosmetic result Universal protocol:    Procedure explained and questions answered to patient or proxy's satisfaction: yes     Relevant documents present and verified: yes     Test results available and properly labeled: yes     Imaging studies available: yes     Required blood products, implants, devices, and special equipment available: yes     Site/side marked: yes     Immediately prior to procedure, a time out was called: yes     Patient identity confirmed:  Verbally with patient Anesthesia (see MAR for exact dosages):    Anesthesia method:  Local infiltration   Local anesthetic:  Lidocaine 1% w/o epi Laceration details:    Location:  Finger   Finger location:  L index finger   Length (cm):  1 Repair type:    Repair type:  Simple Pre-procedure details:    Preparation:  Patient was prepped and draped in usual sterile fashion and imaging obtained to evaluate for foreign bodies Exploration:    Hemostasis achieved with:  Direct pressure   Wound exploration: wound explored through full range of motion     Contaminated: no   Treatment:    Area cleansed with:  Betadine   Irrigation solution:  Sterile saline   Irrigation method:  Syringe Skin repair:    Repair method:  Sutures   Suture material:  Fast-absorbing gut (vicryl on nailbed, nylon on finger)   Suture technique:  Simple interrupted Approximation:    Approximation:  Close Post-procedure details:    Dressing:  Bulky dressing   Patient tolerance of procedure:  Tolerated well, no immediate complications   (including critical care  time)  Medications Ordered in ED Medications  Tdap (BOOSTRIX) injection 0.5 mL (0.5 mLs Intramuscular Given 09/12/19 0506)  lidocaine (PF) (XYLOCAINE) 1 % injection 30 mL (30 mLs Infiltration Given 09/12/19 0714)  HYDROcodone-acetaminophen (NORCO/VICODIN) 5-325 MG per tablet 1 tablet (1 tablet Oral Given 09/12/19 7371)    ED Course  I have reviewed the triage vital signs and the nursing notes.  Pertinent labs & imaging results that were available during my care of the patient were reviewed by me and considered in my medical decision making (see chart for details).    MDM Rules/Calculators/A&P  Patient with avulsion of left index finger nail with nailbed laceration extending to the ulnar aspect of the tip of the finger.  X-ray shows tuft fracture.  Nailbed repaired with Vicryl sutures, skin repaired with Prolene.  Bulky dressing placed over wound.  Will initiate empiric antibiotic coverage.  Tetanus updated.  Follow-up with hand surgery on-call.  Final Clinical Impression(s) / ED Diagnoses Final diagnoses:  Open fracture of tuft of distal phalanx of finger  Nail avulsion, finger, initial encounter    Rx / DC Orders ED Discharge Orders         Ordered    doxycycline (VIBRAMYCIN) 100 MG capsule  2 times daily     09/12/19 0731    HYDROcodone-acetaminophen (NORCO/VICODIN) 5-325 MG tablet  Every 4 hours PRN     09/12/19 0731           Gilda CreasePollina, Dmari Schubring J, MD 09/12/19 [id number]    Gilda CreasePollina, Chastelyn Athens J, MD 09/23/19 [phone]

## 2019-09-12 NOTE — Discharge Instructions (Signed)
You will need sutures removed in 10 days.  You need to call Dr. Fredna Dow to schedule follow-up to make sure your fracture is healing well.

## 2019-09-12 NOTE — ED Triage Notes (Signed)
Pt smashed L index finger this evening and lost fingernail in process approximately 1hr PTA. Finger currently bandaged.

## 2020-04-06 ENCOUNTER — Emergency Department (HOSPITAL_COMMUNITY)
Admission: EM | Admit: 2020-04-06 | Discharge: 2020-04-06 | Disposition: A | Discharge status: Home / Self Care | Payer: Self-pay | Attending: Emergency Medicine | Admitting: Emergency Medicine

## 2020-04-06 ENCOUNTER — Encounter (HOSPITAL_COMMUNITY): Payer: Self-pay

## 2020-04-06 ENCOUNTER — Other Ambulatory Visit: Payer: Self-pay

## 2020-04-06 DIAGNOSIS — F17210 Nicotine dependence, cigarettes, uncomplicated: Secondary | ICD-10-CM | POA: Insufficient documentation

## 2020-04-06 DIAGNOSIS — M545 Low back pain, unspecified: Secondary | ICD-10-CM

## 2020-04-06 DIAGNOSIS — L03012 Cellulitis of left finger: Secondary | ICD-10-CM | POA: Insufficient documentation

## 2020-04-06 MED ORDER — CYCLOBENZAPRINE HCL 10 MG PO TABS: 10.0000 mg | ORAL_TABLET | Freq: Two times a day (BID) | ORAL | 0 refills | Status: DC | PRN

## 2020-04-06 MED ORDER — CEPHALEXIN 500 MG PO CAPS: 500.0000 mg | ORAL_CAPSULE | Freq: Three times a day (TID) | ORAL | 0 refills | Status: AC

## 2020-04-06 MED ORDER — MELOXICAM 7.5 MG PO TABS: 7.5000 mg | ORAL_TABLET | Freq: Two times a day (BID) | ORAL | 0 refills | Status: AC | PRN

## 2020-04-06 NOTE — ED Triage Notes (Signed)
Pt reports chronic lower back pain.  Says pain flared up after spreading mulch Friday.  Also c/o tenderness to tip of left thumb.  Denies injury.

## 2020-04-06 NOTE — Discharge Instructions (Signed)
Mobic Flexeril Keflex  Thank you for letting us take care of you today!  Please obtain all of your results from medical records or have your doctors office obtain the results - share them with your doctor - you should be seen at your doctors office in the next 2 days. Call today to arrange your follow up. Take the medications as prescribed. Please review all of the medicines and only take them if you do not have an allergy to them. Please be aware that if you are taking birth control pills, taking other prescriptions, ESPECIALLY ANTIBIOTICS may make the birth control ineffective - if this is the case, either do not engage in sexual activity or use alternative methods of birth control such as condoms until you have finished the medicine and your family doctor says it is OK to restart them. If you are on a blood thinner such as COUMADIN, be aware that any other medicine that you take may cause the coumadin to either work too much, or not enough - you should have your coumadin level rechecked in next 7 days if this is the case.  ?  It is also a possibility that you have an allergic reaction to any of the medicines that you have been prescribed - Everybody reacts differently to medications and while MOST people have no trouble with most medicines, you may have a reaction such as nausea, vomiting, rash, swelling, shortness of breath. If this is the case, please stop taking the medicine immediately and contact your physician.   If you were given a medication in the ED such as percocet, vicodin, or morphine, be aware that these medicines are sedating and may change your ability to take care of yourself adequately for several hours after being given this medicines - you should not drive or take care of small children if you were given this medicine in the Emergency Department or if you have been prescribed these types of medicines. ?   You should return to the ER IMMEDIATELY if you develop severe or worsening  symptoms.

## 2020-04-06 NOTE — ED Provider Notes (Signed)
[facility] EMERGENCY DEPARTMENT Provider Note   CSN: [id number] Arrival date & time: 04/06/20  5732     History Chief Complaint  Patient presents with  . Back Pain    Erik Branch is a 34 y.o. male.  HPI   34 year old male, he has no prior back surgical problems, denies history of IV drug use or cancer, denies any numbness or weakness of the legs, denies any difficulty urinating.  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  This is been persistent, seems to be worse with movement, not associated with any pathological factors for back pain.  Also complains of left thumb pain with some tenderness and hardness of the tip of the thumb, he tried to use a safety pin to puncture it but nothing came out.  History reviewed. No pertinent past medical history.  There are no problems to display for this patient.   Past Surgical History:  Procedure Laterality Date  . HERNIA REPAIR         No family history on file.  Social History   Tobacco Use  . Smoking status: Current Every Day Smoker    Types: Cigarettes  . Smokeless tobacco: Never Used  Substance Use Topics  . Alcohol use: No  . Drug use: Not Currently    Types: Marijuana    Home Medications Prior to Admission medications   Medication Sig Start Date End Date Taking? Authorizing Provider  cephALEXin (KEFLEX) 500 MG capsule Take 1 capsule (500 mg total) by mouth 3 (three) times daily for 7 days. 04/06/20 04/13/20  Eber Hong, MD  cyclobenzaprine (FLEXERIL) 10 MG tablet Take 1 tablet (10 mg total) by mouth 2 (two) times daily as needed for muscle spasms. 04/06/20   Eber Hong, MD  fexofenadine (ALLEGRA) 180 MG tablet Take 180 mg by mouth daily.    [provider]  meloxicam (MOBIC) 7.5 MG tablet Take 1 tablet (7.5 mg total) by mouth 2 (two) times daily as needed for up to 14 days for pain. 04/06/20  04/20/20  Eber Hong, MD    Allergies    Patient has no known allergies.  Review of Systems   Review of Systems  Constitutional: Negative for fever.  Genitourinary: Negative for difficulty urinating.  Musculoskeletal: Positive for back pain.  Neurological: Negative for weakness and numbness.    Physical Exam Updated Vital Signs BP 138/88 (BP Location: Right Arm)   Pulse 74   Temp 97.6 F (36.4 C) (Oral)   Resp 18   Ht 1.753 m (5\' 9" )   Wt 76.2 kg   SpO2 100%   BMI 24.81 kg/m   Physical Exam Vitals and nursing note reviewed.  Constitutional:      Appearance: He is well-developed. He is not diaphoretic.  HENT:     Head: Normocephalic and atraumatic.  Eyes:     General:        Right eye: No discharge.        Left eye: No discharge.     Conjunctiva/sclera: Conjunctivae normal.  Pulmonary:     Effort: Pulmonary effort is normal. No respiratory distress.  Musculoskeletal:        General: Tenderness present.     Comments: Left lower back tenderness, no midline tenderness.  L fingertip with tenderness and some induration - no fluctuance  Skin:    General: Skin is warm and dry.  Findings: No erythema or rash.  Neurological:     Mental Status: He is alert.     Coordination: Coordination normal.     Comments: Normal reflexes sensation and strength in the bilateral lower extremities     ED Results / Procedures / Treatments   Labs (all labs ordered are listed, but only abnormal results are displayed) Labs Reviewed - No data to display  EKG None  Radiology No results found.  Procedures Procedures (including critical care time)  Medications Ordered in ED Medications - No data to display  ED Course  I have reviewed the triage vital signs and the nursing notes.  Pertinent labs & imaging results that were available during my care of the patient were reviewed by me and considered in my medical decision making (see chart for details).    MDM  Rules/Calculators/A&P                          Neck pain is nonpathological, this seems to be musculoskeletal, additionally he appears to have a felon of the left thumb, it is mild, and will not require drainage,   no neurologicDysfunction, stable for discharge with conservative therapy, muscle relaxer, NSAID, cephalexin. vital signs are normal,  Final Clinical Impression(s) / ED Diagnoses   Final diagnoses:  Acute left-sided low back pain without sciatica  Felon of finger of left hand    Rx / DC Orders ED Discharge Orders         Ordered    cephALEXin (KEFLEX) 500 MG capsule  3 times daily     Discontinue  Reprint     04/06/20 1038    meloxicam (MOBIC) 7.5 MG tablet  2 times daily PRN     Discontinue  Reprint     04/06/20 1038    cyclobenzaprine (FLEXERIL) 10 MG tablet  2 times daily PRN     Discontinue  Reprint     04/06/20 1038           Eber Hong, MD 04/06/20 1039

## 2020-09-12 ENCOUNTER — Emergency Department (HOSPITAL_COMMUNITY)
Admission: EM | Admit: 2020-09-12 | Discharge: 2020-09-12 | Disposition: A | Discharge status: Home / Self Care | Payer: Self-pay | Attending: Emergency Medicine | Admitting: Emergency Medicine

## 2020-09-12 ENCOUNTER — Encounter (HOSPITAL_COMMUNITY): Payer: Self-pay | Admitting: *Deleted

## 2020-09-12 ENCOUNTER — Other Ambulatory Visit: Payer: Self-pay

## 2020-09-12 DIAGNOSIS — R110 Nausea: Secondary | ICD-10-CM | POA: Insufficient documentation

## 2020-09-12 DIAGNOSIS — R0981 Nasal congestion: Secondary | ICD-10-CM | POA: Insufficient documentation

## 2020-09-12 DIAGNOSIS — R6883 Chills (without fever): Secondary | ICD-10-CM | POA: Insufficient documentation

## 2020-09-12 DIAGNOSIS — Z20822 Contact with and (suspected) exposure to covid-19: Secondary | ICD-10-CM | POA: Insufficient documentation

## 2020-09-12 DIAGNOSIS — F17210 Nicotine dependence, cigarettes, uncomplicated: Secondary | ICD-10-CM | POA: Insufficient documentation

## 2020-09-12 LAB — RESP PANEL BY RT-PCR (FLU A&B, COVID) ARPGX2
Influenza A by PCR: NEGATIVE
Influenza B by PCR: NEGATIVE
SARS Coronavirus 2 by RT PCR: NEGATIVE

## 2020-09-12 NOTE — ED Triage Notes (Signed)
Nausea, diarrhea and congestion, left work yesterday, needs work note

## 2020-09-12 NOTE — ED Provider Notes (Signed)
[facility] EMERGENCY DEPARTMENT Provider Note   CSN: [id number] Arrival date & time: 09/12/20  1750     History Chief Complaint  Patient presents with  . Nausea    Erik Branch is a 34 y.o. male without significant past medical history who presents to the emergency department for evaluation prior to return to work after having an illness yesterday. Patient states he left work early as he felt nauseated with upset stomach, dry heaving, as well as chills and some nasal congestion. Today he feels completely back to normal, other than feeling a bit sore from the dry heaving, no alleviating or aggravating factors. He is tolerating p.o. today, had a bag of chips in the lobby, drank a [address], and had a chicken sandwich for lunch. He denies fever, hematemesis, melena, hematochezia, cough, dyspnea, chest pain, dysuria, or syncope.  HPI     History reviewed. No pertinent past medical history.  There are no problems to display for this patient.   Past Surgical History:  Procedure Laterality Date  . HERNIA REPAIR         No family history on file.  Social History   Tobacco Use  . Smoking status: Current Every Day Smoker    Types: Cigarettes  . Smokeless tobacco: Never Used  Substance Use Topics  . Alcohol use: No  . Drug use: Not Currently    Types: Marijuana    Home Medications Prior to Admission medications   Medication Sig Start Date End Date Taking? Authorizing Provider  cyclobenzaprine (FLEXERIL) 10 MG tablet Take 1 tablet (10 mg total) by mouth 2 (two) times daily as needed for muscle spasms. 04/06/20   Eber Hong, MD  fexofenadine (ALLEGRA) 180 MG tablet Take 180 mg by mouth daily.    [provider]    Allergies    Patient has no known allergies.  Review of Systems   Review of Systems  Constitutional: Positive for chills. Negative for fever.  HENT: Positive for congestion. Negative for ear pain and sore throat.   Respiratory: Negative for  cough and shortness of breath.   Cardiovascular: Negative for chest pain.  Gastrointestinal: Positive for abdominal pain, nausea and vomiting (dry heaving). Negative for blood in stool.  Genitourinary: Negative for dysuria.  Neurological: Negative for syncope.  All other systems reviewed and are negative.   Physical Exam Updated Vital Signs BP 133/86 (BP Location: Right Arm)   Pulse 85   Temp 98 F (36.7 C) (Oral)   Resp 20   SpO2 98%   Physical Exam Vitals and nursing note reviewed.  Constitutional:      General: He is not in acute distress.    Appearance: He is well-developed. He is not toxic-appearing.  HENT:     Head: Normocephalic and atraumatic.     Right Ear: Ear canal normal. Tympanic membrane is not perforated, erythematous, retracted or bulging.     Left Ear: Ear canal normal. Tympanic membrane is not perforated, erythematous, retracted or bulging.     Ears:     Comments: No mastoid erythema/swellng/tenderness.     Nose:     Right Sinus: No maxillary sinus tenderness or frontal sinus tenderness.     Left Sinus: No maxillary sinus tenderness or frontal sinus tenderness.     Mouth/Throat:     Pharynx: Oropharynx is clear. Uvula midline. No oropharyngeal exudate or posterior oropharyngeal erythema.     Comments: Posterior oropharynx is symmetric appearing. Patient tolerating own secretions without difficulty. No  trismus. No drooling. No hot potato voice. No swelling beneath the tongue, submandibular compartment is soft.  Eyes:     General:        Right eye: No discharge.        Left eye: No discharge.     Conjunctiva/sclera: Conjunctivae normal.  Cardiovascular:     Rate and Rhythm: Normal rate and regular rhythm.  Pulmonary:     Effort: Pulmonary effort is normal. No respiratory distress.     Breath sounds: Normal breath sounds. No wheezing, rhonchi or rales.  Abdominal:     General: There is no distension.     Palpations: Abdomen is soft.     Tenderness: There  is no abdominal tenderness. There is no guarding or rebound.  Musculoskeletal:     Cervical back: Neck supple. No rigidity.  Lymphadenopathy:     Cervical: No cervical adenopathy.  Skin:    General: Skin is warm and dry.     Findings: No rash.  Neurological:     Mental Status: He is alert.  Psychiatric:        Behavior: Behavior normal.     ED Results / Procedures / Treatments   Labs (all labs ordered are listed, but only abnormal results are displayed) Labs Reviewed  RESP PANEL BY RT-PCR (FLU A&B, COVID) [id number]    EKG None  Radiology No results found.  Procedures Procedures (including critical care time)  Medications Ordered in ED Medications - No data to display  ED Course  I have reviewed the triage vital signs and the nursing notes.  Pertinent labs & imaging results that were available during my care of the patient were reviewed by me and considered in my medical decision making (see chart for details).    MDM Rules/Calculators/A&P                          Patient presents to the emergency department with complaints of not feeling well yesterday with nausea, dry heaving, chills, and congestion. Symptoms are resolved today. He is nontoxic, resting comfortably, his vitals are within normal limits. A Covid/influenza swab was obtained by triage, this is negative. Today he is asymptomatic and has a benign physical exam. No signs of respiratory distress. Lungs are clear. Abdomen is nontender without peritoneal signs. We discussed the option of laboratory testing patient states he feels completely fine, declines this, and would like to go home, he needs a note to go back to work. Given reassuring exam and patient tolerating PO he appears appropriate for discharge. I discussed results, treatment plan, need for follow-up, and return precautions with the patient. Provided opportunity for questions, patient confirmed understanding and is in agreement with plan.   Final Clinical  Impression(s) / ED Diagnoses Final diagnoses:  Nausea    Rx / DC Orders ED Discharge Orders    None       Cherly Anderson, PA-C 09/12/20 1932    Bethann Berkshire, MD 09/13/20 1141

## 2020-09-12 NOTE — Discharge Instructions (Addendum)
You were seen in the emergency department today for with suspect was a viral illness. Your Covid and influenza tests are negative.  Return to the ER for any new or worsening symptoms or any other concerns.

## 2021-05-07 ENCOUNTER — Emergency Department (HOSPITAL_COMMUNITY)
Admission: EM | Admit: 2021-05-07 | Discharge: 2021-05-07 | Disposition: A | Discharge status: Home / Self Care | Payer: Self-pay | Attending: Emergency Medicine | Admitting: Emergency Medicine

## 2021-05-07 ENCOUNTER — Encounter (HOSPITAL_COMMUNITY): Payer: Self-pay

## 2021-05-07 ENCOUNTER — Other Ambulatory Visit: Payer: Self-pay

## 2021-05-07 DIAGNOSIS — Z23 Encounter for immunization: Secondary | ICD-10-CM | POA: Insufficient documentation

## 2021-05-07 DIAGNOSIS — W268XXA Contact with other sharp object(s), not elsewhere classified, initial encounter: Secondary | ICD-10-CM | POA: Insufficient documentation

## 2021-05-07 DIAGNOSIS — Y9389 Activity, other specified: Secondary | ICD-10-CM | POA: Insufficient documentation

## 2021-05-07 DIAGNOSIS — S61412A Laceration without foreign body of left hand, initial encounter: Secondary | ICD-10-CM

## 2021-05-07 DIAGNOSIS — F17210 Nicotine dependence, cigarettes, uncomplicated: Secondary | ICD-10-CM | POA: Insufficient documentation

## 2021-05-07 MED ORDER — TETANUS-DIPHTH-ACELL PERTUSSIS 5-2.5-18.5 LF-MCG/0.5 IM SUSY
0.5000 mL | PREFILLED_SYRINGE | Freq: Once | INTRAMUSCULAR | Status: AC
  Administered 2021-05-07: 0.5 mL via INTRAMUSCULAR
  Filled 2021-05-07: qty 0.5

## 2021-05-07 MED ORDER — LIDOCAINE-EPINEPHRINE (PF) 2 %-1:200000 IJ SOLN
10.0000 mL | Freq: Once | INTRAMUSCULAR | Status: AC
  Administered 2021-05-07: 10 mL
  Filled 2021-05-07: qty 20

## 2021-05-07 NOTE — ED Provider Notes (Signed)
Medical screening examination/treatment/procedure(s) were conducted as a shared visit with non-physician practitioner(s) and myself.  I personally evaluated the patient during the encounter.  Clinical Impression:   Final diagnoses:  None    This patient is a well-appearing 35 year old male presenting with a laceration to the palm of his hand that occurred after he was trying to open up a can of corn.  He tried to take the lid off with his hand after it was not completely removed with the can opener.  On exam there is a linear laceration in the transverse location over the palm of the hand, minimal gaping, no active bleeding, no drainage or discharge, no exposed tendons, he has the full ability to flex against resistance without any signs of tendon injury, there is no active bleeding, normal sensation and cap refill distal to the injury.  Update tetanus, stable for discharge afterwards.  I personally supervised the wound repair / laceration repair.   Eber Hong, MD 05/08/21 [phone]

## 2021-05-07 NOTE — ED Triage Notes (Signed)
Pt. Cut left hand on cream on corn can lid. Pt. Had a 3 in laceration to palm of left hand. Bleeding is controlled at this time.

## 2021-05-07 NOTE — ED Provider Notes (Signed)
[facility] EMERGENCY DEPARTMENT Provider Note   CSN: [id number] Arrival date & time: 05/07/21  6270     History Chief Complaint  Patient presents with   Extremity Laceration    Erik Branch is a 35 y.o. male.  Erik Branch is a 35 y/o male who presents today with a hand laceration. Patient was opening a can of cream corn and sliced his left hand. Tried cleaning with hydrogen peroxide. Bleeding is controlled. Normal range of motion of the left hand with mild pain. No other complaints at this time. Unsure of last tetanus vaccination.   The history is provided by the patient.  Hand Injury Location:  Hand Hand location:  L palm Injury: yes   Time since incident:  2 hours Mechanism of injury comment:  Cut hand on can of creamed corn Pain details:    Radiates to:  Does not radiate   Severity:  Mild Foreign body present:  No foreign bodies Tetanus status:  Out of date Associated symptoms: no fever       History reviewed. No pertinent past medical history.  There are no problems to display for this patient.   Past Surgical History:  Procedure Laterality Date   HERNIA REPAIR         History reviewed. No pertinent family history.  Social History   Tobacco Use   Smoking status: Every Day    Types: Cigarettes   Smokeless tobacco: Never  Substance Use Topics   Alcohol use: No   Drug use: Not Currently    Types: Marijuana    Home Medications Prior to Admission medications   Medication Sig Start Date End Date Taking? Authorizing Provider  cyclobenzaprine (FLEXERIL) 10 MG tablet Take 1 tablet (10 mg total) by mouth 2 (two) times daily as needed for muscle spasms. 04/06/20   Eber Hong, MD  fexofenadine (ALLEGRA) 180 MG tablet Take 180 mg by mouth daily.    [provider]    Allergies    Patient has no known allergies.  Review of Systems   Review of Systems  Constitutional:  Negative for chills and fever.  Respiratory:  Negative for  shortness of breath.   Cardiovascular:  Negative for chest pain.  Gastrointestinal:  Negative for abdominal pain and vomiting.  Skin:  Positive for wound. Negative for color change.       2 inch laceration on left hand  Neurological:  Negative for light-headedness.  All other systems reviewed and are negative.  Physical Exam Updated Vital Signs BP (!) 117/97 (BP Location: Right Arm)   Pulse 96   Temp 97.8 F (36.6 C)   Resp 17   Ht 5\' 7"  (1.702 m)   Wt 74.8 kg   SpO2 96%   BMI 25.84 kg/m   Physical Exam Vitals and nursing note reviewed.  Constitutional:      Appearance: Normal appearance.  HENT:     Head: Normocephalic and atraumatic.  Eyes:     Conjunctiva/sclera: Conjunctivae normal.  Cardiovascular:     Pulses: Normal pulses.  Musculoskeletal:        General: Normal range of motion.  Skin:    General: Skin is warm and dry.     Findings: Wound present.     Comments: Approximately 3 inch liner laceration on palmar aspect of left hand, no tendon visualized, bleeding controlled  Neurological:     General: No focal deficit present.     Mental Status: He is alert and oriented to  person, place, and time.    ED Results / Procedures / Treatments   Labs (all labs ordered are listed, but only abnormal results are displayed) Labs Reviewed - No data to display  EKG None  Radiology No results found.  Procedures .Marland KitchenLaceration Repair  Date/Time: 05/07/2021 9:23 PM Performed by: Su Monks, PA-C Authorized by: Su Monks, PA-C   Consent:    Consent obtained:  Verbal   Consent given by:  Patient   Risks, benefits, and alternatives were discussed: yes     Risks discussed:  Infection and pain Universal protocol:    Procedure explained and questions answered to patient or proxy's satisfaction: yes     Patient identity confirmed:  Verbally with patient Anesthesia:    Anesthesia method:  Local infiltration   Local anesthetic:  Lidocaine 2% WITH  epi Laceration details:    Location:  Hand   Hand location:  L palm   Length (cm):  6 Pre-procedure details:    Preparation:  Patient was prepped and draped in usual sterile fashion Exploration:    Limited defect created (wound extended): no     Hemostasis achieved with:  Epinephrine and direct pressure   Wound extent: no tendon damage noted   Treatment:    Area cleansed with:  Saline   Amount of cleaning:  Standard   Irrigation solution:  Sterile saline   Irrigation method:  Syringe   Visualized foreign bodies/material removed: no     Debridement:  None Skin repair:    Repair method:  Sutures   Suture size:  4-0   Suture material:  Prolene   Suture technique:  Simple interrupted   Number of sutures:  7 Approximation:    Approximation:  Close Repair type:    Repair type:  Simple Post-procedure details:    Dressing:  Sterile dressing   Procedure completion:  Tolerated   Medications Ordered in ED Medications  Tdap (BOOSTRIX) injection 0.5 mL (0.5 mLs Intramuscular Given 05/07/21 2039)  lidocaine-EPINEPHrine (XYLOCAINE W/EPI) 2 %-1:200000 (PF) injection 10 mL (10 mLs Infiltration Given 05/07/21 2038)    ED Course  I have reviewed the triage vital signs and the nursing notes.  Pertinent labs & imaging results that were available during my care of the patient were reviewed by me and considered in my medical decision making (see chart for details).    MDM Rules/Calculators/A&P                           Patient is 35 y/o male who presents with left hand laceration. Patient cut his hand while opening a can of creamed corn. On physical exam, patient has an approximately 3 inch linear laceration to the palmar aspect of the hand. Patient has full range of motion against resistance of the left hand. Sensation in tact. Bleeding is controlled. No overlying erythema or swelling.   Pressure irrigation performed. Wound explored and base of wound visualized in a bloodless field without  evidence of foreign body.  Laceration occurred < 8 hours prior to repair which was well tolerated. Tdap vaccination given.  Pt has  no comorbidities to effect normal wound healing. Pt discharged  without antibiotics.  Discussed suture home care with patient and answered questions. Pt to follow-up for wound check and suture removal in 7 days; they are to return to the ED sooner for signs of infection. Pt is hemodynamically stable with no complaints prior to dc.   Final  Clinical Impression(s) / ED Diagnoses Final diagnoses:  Laceration of left hand without foreign body, initial encounter    Rx / DC Orders ED Discharge Orders     None        Lya Holben, Raynelle Dick 05/07/21 2136    Eber Hong, MD 05/08/21 1606

## 2021-05-07 NOTE — Discharge Instructions (Addendum)
We irrigated your wound and closed it with 7 sutures and a sterile dressing. Evaluate for signs of infection as discussed to include redness, swelling, or difficulty moving your hand. Take over the counter medication for pain as needed.

## 2021-08-21 ENCOUNTER — Encounter (HOSPITAL_COMMUNITY): Payer: Self-pay | Admitting: Emergency Medicine

## 2021-08-21 ENCOUNTER — Emergency Department (HOSPITAL_COMMUNITY)
Admission: EM | Admit: 2021-08-21 | Discharge: 2021-08-21 | Disposition: A | Discharge status: Home / Self Care | Payer: Self-pay | Attending: Emergency Medicine | Admitting: Emergency Medicine

## 2021-08-21 ENCOUNTER — Other Ambulatory Visit: Payer: Self-pay

## 2021-08-21 DIAGNOSIS — M545 Low back pain, unspecified: Secondary | ICD-10-CM | POA: Insufficient documentation

## 2021-08-21 DIAGNOSIS — W010XXA Fall on same level from slipping, tripping and stumbling without subsequent striking against object, initial encounter: Secondary | ICD-10-CM | POA: Insufficient documentation

## 2021-08-21 DIAGNOSIS — Y92002 Bathroom of unspecified non-institutional (private) residence single-family (private) house as the place of occurrence of the external cause: Secondary | ICD-10-CM | POA: Insufficient documentation

## 2021-08-21 DIAGNOSIS — F17210 Nicotine dependence, cigarettes, uncomplicated: Secondary | ICD-10-CM | POA: Insufficient documentation

## 2021-08-21 MED ORDER — CYCLOBENZAPRINE HCL 10 MG PO TABS: 10.0000 mg | ORAL_TABLET | Freq: Two times a day (BID) | ORAL | 0 refills | Status: DC | PRN

## 2021-08-21 MED ORDER — KETOROLAC TROMETHAMINE 30 MG/ML IJ SOLN
15.0000 mg | Freq: Once | INTRAMUSCULAR | Status: AC
  Administered 2021-08-21: 16:00:00 15 mg via INTRAMUSCULAR
  Filled 2021-08-21: qty 1

## 2021-08-21 NOTE — Discharge Instructions (Signed)
You have been seen here for back pain. I recommend taking over-the-counter pain medications like ibuprofen and/or Tylenol every 6 as needed.  Please follow dosage and on the back of bottle.  I also recommend applying heat to the area and stretching out the muscles as this will help decrease stiffness and pain.  I have given you information on exercises please follow.  Please follow-up with PCP for further evaluation symptoms do not resolve in a week's time.  Come back to the emergency department if you develop chest pain, shortness of breath, severe abdominal pain, uncontrolled nausea, vomiting, diarrhea.

## 2021-08-21 NOTE — ED Provider Notes (Signed)
[facility] EMERGENCY DEPARTMENT Provider Note   CSN: [id number] Arrival date & time: 08/21/21  1229     History Chief Complaint  Patient presents with   Back Pain    Erik Branch is a 35 y.o. male.  HPI  Patient with no medical history presents with complaints of right lower back pain.  Patient states pain started on Sunday, started after he slipped and fell in the bathroom.  He states he landed directly on his back, denies hitting his head, losing conscious, is not on anticoagulant.  He endorses that the pain remains in his back does not radiate, is worsened with movement improved with rest, denies paresthesia or weakness to lower extremities, denies urinary constant, retention of difficult bowel movements.  He has no other complaints at this time.  He is not taking pain medication.  He denies fevers, chills, chest pain, shortness breath, abdominal pain, general body aches.  History reviewed. No pertinent past medical history.  There are no problems to display for this patient.   Past Surgical History:  Procedure Laterality Date   HERNIA REPAIR         History reviewed. No pertinent family history.  Social History   Tobacco Use   Smoking status: Every Day    Types: Cigarettes   Smokeless tobacco: Never  Substance Use Topics   Alcohol use: No   Drug use: Not Currently    Types: Marijuana    Home Medications Prior to Admission medications   Medication Sig Start Date End Date Taking? Authorizing Provider  cyclobenzaprine (FLEXERIL) 10 MG tablet Take 1 tablet (10 mg total) by mouth 2 (two) times daily as needed for muscle spasms. 08/21/21  Yes Carroll Sage, PA-C  fexofenadine (ALLEGRA) 180 MG tablet Take 180 mg by mouth daily.    [provider]    Allergies    Patient has no known allergies.  Review of Systems   Review of Systems  Constitutional:  Negative for chills and fever.  HENT:  Negative for congestion.   Respiratory:  Negative for  shortness of breath.   Cardiovascular:  Negative for chest pain.  Gastrointestinal:  Negative for abdominal pain.  Genitourinary:  Negative for enuresis.  Musculoskeletal:  Positive for back pain.  Skin:  Negative for rash.  Neurological:  Negative for dizziness.  Hematological:  Does not bruise/bleed easily.   Physical Exam Updated Vital Signs BP (!) 142/95 (BP Location: Right Arm)   Pulse 89   Temp 98.6 F (37 C) (Oral)   Resp 18   Ht 5\' 7"  (1.702 m)   Wt 74.8 kg   SpO2 97%   BMI 25.84 kg/m   Physical Exam Vitals and nursing note reviewed.  Constitutional:      General: He is not in acute distress.    Appearance: He is not ill-appearing.  HENT:     Head: Normocephalic and atraumatic.     Comments: No deformities of the head present, no raccoon eyes or battle sign noted.    Nose: No congestion.  Eyes:     Extraocular Movements: Extraocular movements intact.     Conjunctiva/sclera: Conjunctivae normal.  Cardiovascular:     Rate and Rhythm: Normal rate and regular rhythm.     Pulses: Normal pulses.     Heart sounds: No murmur heard.   No friction rub. No gallop.  Pulmonary:     Effort: No respiratory distress.     Breath sounds: No wheezing, rhonchi or rales.  Musculoskeletal:     Cervical back: No tenderness.     Comments: Has 5 of 5 strength, neurovascular intact in lower extremities, able to ambulate without difficulty.  Spine was palpated is nontender to palpation.  Has slight tenderness ovation within the musculature on the right iliac crest no overlying skin changes present.  Skin:    General: Skin is warm and dry.  Neurological:     Mental Status: He is alert.  Psychiatric:        Mood and Affect: Mood normal.    ED Results / Procedures / Treatments   Labs (all labs ordered are listed, but only abnormal results are displayed) Labs Reviewed - No data to display  EKG None  Radiology No results found.  Procedures Procedures   Medications Ordered  in ED Medications  ketorolac (TORADOL) 30 MG/ML injection 15 mg (has no administration in time range)    ED Course  I have reviewed the triage vital signs and the nursing notes.  Pertinent labs & imaging results that were available during my care of the patient were reviewed by me and considered in my medical decision making (see chart for details).    MDM Rules/Calculators/A&P                          Initial impression-presents with lower back pain.  Alert no acute distress vital signs reassuring.  Will provide him with Toradol.  Work-up-due to well-appearing patient, benign physical exam, further lab work and imaging were not warranted at this time.  Rule out- I have low suspicion for spinal fracture or spinal cord abnormality as patient denies urinary incontinency, retention, difficulty with bowel movements, denies saddle paresthesias.  Spine was palpated there is no step-off, crepitus or gross deformities felt, patient had 5/5 strength, full range of motion, neurovascular fully intact in the lower extremities.  Will defer imaging as there is no tenderness along his spine or over bony structures. Low suspicion for septic arthritis as patient denies IV drug use, skin exam was performed no erythematous, edema or warm joints noted.   Plan-  Back pain-likely muscular strain, recommend over-the-counter pain medications, from with muscle relaxers, follow-up PCP for further evaluation.  Given strict return precautions.  Vital signs have remained stable, no indication for hospital admission.   Patient given at home care as well strict return precautions.  Patient verbalized that they understood agreed to said plan.  Final Clinical Impression(s) / ED Diagnoses Final diagnoses:  Acute right-sided low back pain without sciatica    Rx / DC Orders ED Discharge Orders          Ordered    cyclobenzaprine (FLEXERIL) 10 MG tablet  2 times daily PRN        08/21/21 1547              Carroll Sage, PA-C 08/21/21 1548    Benjiman Core, MD 08/22/21 [phone]

## 2021-08-21 NOTE — ED Triage Notes (Signed)
Pt here from home with c/o lower back pian after slipping in the shower on Sunday

## 2021-08-29 ENCOUNTER — Emergency Department (HOSPITAL_COMMUNITY): Payer: Self-pay

## 2021-08-29 ENCOUNTER — Emergency Department (HOSPITAL_COMMUNITY)
Admission: EM | Admit: 2021-08-29 | Discharge: 2021-08-29 | Disposition: A | Discharge status: Home / Self Care | Payer: Self-pay | Attending: Emergency Medicine | Admitting: Emergency Medicine

## 2021-08-29 ENCOUNTER — Other Ambulatory Visit: Payer: Self-pay

## 2021-08-29 DIAGNOSIS — F17210 Nicotine dependence, cigarettes, uncomplicated: Secondary | ICD-10-CM | POA: Insufficient documentation

## 2021-08-29 DIAGNOSIS — R42 Dizziness and giddiness: Secondary | ICD-10-CM | POA: Insufficient documentation

## 2021-08-29 DIAGNOSIS — E876 Hypokalemia: Secondary | ICD-10-CM | POA: Insufficient documentation

## 2021-08-29 DIAGNOSIS — R569 Unspecified convulsions: Secondary | ICD-10-CM | POA: Insufficient documentation

## 2021-08-29 DIAGNOSIS — Y990 Civilian activity done for income or pay: Secondary | ICD-10-CM | POA: Insufficient documentation

## 2021-08-29 DIAGNOSIS — R55 Syncope and collapse: Secondary | ICD-10-CM | POA: Insufficient documentation

## 2021-08-29 LAB — RAPID URINE DRUG SCREEN, HOSP PERFORMED
Amphetamines: NOT DETECTED
Barbiturates: NOT DETECTED
Benzodiazepines: POSITIVE — AB
Cocaine: NOT DETECTED
Opiates: POSITIVE — AB
Tetrahydrocannabinol: POSITIVE — AB

## 2021-08-29 LAB — URINALYSIS, ROUTINE W REFLEX MICROSCOPIC
Bilirubin Urine: NEGATIVE
Glucose, UA: NEGATIVE mg/dL
Hgb urine dipstick: NEGATIVE
Ketones, ur: NEGATIVE mg/dL
Leukocytes,Ua: NEGATIVE
Nitrite: NEGATIVE
Protein, ur: NEGATIVE mg/dL
Specific Gravity, Urine: 1.01 (ref 1.005–1.030)
pH: 6.5 (ref 5.0–8.0)

## 2021-08-29 LAB — CBC WITH DIFFERENTIAL/PLATELET
Abs Immature Granulocytes: 0.02 10*3/uL (ref 0.00–0.07)
Basophils Absolute: 0 10*3/uL (ref 0.0–0.1)
Basophils Relative: 1 %
Eosinophils Absolute: 0.4 10*3/uL (ref 0.0–0.5)
Eosinophils Relative: 5 %
HCT: 46.3 % (ref 39.0–52.0)
Hemoglobin: 15.8 g/dL (ref 13.0–17.0)
Immature Granulocytes: 0 %
Lymphocytes Relative: 26 %
Lymphs Abs: 1.9 10*3/uL (ref 0.7–4.0)
MCH: 30.9 pg (ref 26.0–34.0)
MCHC: 34.1 g/dL (ref 30.0–36.0)
MCV: 90.4 fL (ref 80.0–100.0)
Monocytes Absolute: 0.7 10*3/uL (ref 0.1–1.0)
Monocytes Relative: 10 %
Neutro Abs: 4.2 10*3/uL (ref 1.7–7.7)
Neutrophils Relative %: 58 %
Platelets: 276 10*3/uL (ref 150–400)
RBC: 5.12 MIL/uL (ref 4.22–5.81)
RDW: 12.3 % (ref 11.5–15.5)
WBC: 7.2 10*3/uL (ref 4.0–10.5)
nRBC: 0 % (ref 0.0–0.2)

## 2021-08-29 LAB — COMPREHENSIVE METABOLIC PANEL
ALT: 21 U/L (ref 0–44)
AST: 22 U/L (ref 15–41)
Albumin: 4.1 g/dL (ref 3.5–5.0)
Alkaline Phosphatase: 65 U/L (ref 38–126)
Anion gap: 10 (ref 5–15)
BUN: 12 mg/dL (ref 6–20)
CO2: 23 mmol/L (ref 22–32)
Calcium: 8.4 mg/dL — ABNORMAL LOW (ref 8.9–10.3)
Chloride: 105 mmol/L (ref 98–111)
Creatinine, Ser: 1.02 mg/dL (ref 0.61–1.24)
GFR, Estimated: >60 mL/min (ref >60)
Glucose, Bld: 79 mg/dL (ref 70–99)
Potassium: 3 mmol/L — ABNORMAL LOW (ref 3.5–5.1)
Sodium: 138 mmol/L (ref 135–145)
Total Bilirubin: 0.7 mg/dL (ref 0.3–1.2)
Total Protein: 6.6 g/dL (ref 6.5–8.1)

## 2021-08-29 LAB — CBG MONITORING, ED: Glucose-Capillary: 95 mg/dL (ref 70–99)

## 2021-08-29 LAB — MAGNESIUM: Magnesium: 2.1 mg/dL (ref 1.7–2.4)

## 2021-08-29 MED ORDER — POTASSIUM CHLORIDE CRYS ER 20 MEQ PO TBCR
40.0000 meq | EXTENDED_RELEASE_TABLET | Freq: Once | ORAL | Status: AC
  Administered 2021-08-29: 40 meq via ORAL
  Filled 2021-08-29: qty 2

## 2021-08-29 MED ORDER — IOHEXOL 300 MG/ML  SOLN: 100.0000 mL | Freq: Once | INTRAMUSCULAR | Status: DC | PRN

## 2021-08-29 MED ORDER — POTASSIUM CHLORIDE CRYS ER 20 MEQ PO TBCR: 20.0000 meq | EXTENDED_RELEASE_TABLET | Freq: Every day | ORAL | 0 refills | Status: DC

## 2021-08-29 MED ORDER — SODIUM CHLORIDE 0.9 % IV BOLUS
1000.0000 mL | Freq: Once | INTRAVENOUS | Status: AC
  Administered 2021-08-29: 1000 mL via INTRAVENOUS

## 2021-08-29 NOTE — ED Triage Notes (Signed)
Pt arrived by EMS after LOC at work today, witnesses say pt has seizure like movements No hx of seizures, no daily meds and no recent drug use per pt   Pt states that he doesn't have much of a memory of waking up and going to work this morning. Pt does remember getting very hot before he passed out  Pt states he has not ate or drank anything since last night, EMS reports CBG of 75.   Pt was wearing hard hat when he fell, does have a laceration under left eye

## 2021-08-29 NOTE — Discharge Instructions (Signed)
-   According to [place] law, you can not drive unless you are seizure / syncope free for at least 6 months and under physician's care.  °  °- Please maintain precautions. Do not participate in activities where a loss of awareness could harm you or someone else. No swimming alone, no tub bathing, no hot tubs, no driving, no operating motorized vehicles (cars, ATVs, motocycles, etc), lawnmowers, power tools or firearms. No standing at heights, such as rooftops, ladders or stairs. Avoid hot objects such as stoves, heaters, open fires. Wear a helmet when riding a bicycle, scooter, skateboard, etc. and avoid areas of traffic. Set your water heater to 120 degrees or less.  °

## 2021-08-29 NOTE — ED Provider Notes (Signed)
[facility] EMERGENCY DEPARTMENT Provider Note   CSN: [id number] Arrival date & time: 08/29/21  1127     History Chief Complaint  Patient presents with   Loss of Consciousness    Erik Branch is a 35 y.o. male.  HPI 35 year old male presents after passing out at work.  He states he went to work and felt fine when he got up this morning.  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  He apparently passed out and struck his face.  He remembers feeling very hot and states it was hot where he works.  Witnesses said he had seizure-like activity.  He denies any known history of seizures.  He did not bite his tongue or have incontinence.  He denies any headache or facial pain.  He states his last Tdap was within the last year.  No chest pain or shortness of breath.  He did not eat or drink anything this morning and had a couple extra coffees and wonders if that could have caused it.  No past medical history on file.  There are no problems to display for this patient.   Past Surgical History:  Procedure Laterality Date   HERNIA REPAIR         No family history on file.  Social History   Tobacco Use   Smoking status: Every Day    Types: Cigarettes   Smokeless tobacco: Never  Substance Use Topics   Alcohol use: No   Drug use: Not Currently    Types: Marijuana    Home Medications Prior to Admission medications   Medication Sig Start Date End Date Taking? Authorizing Provider  potassium chloride SA (KLOR-CON M) 20 MEQ tablet Take 1 tablet (20 mEq total) by mouth daily. 08/29/21  Yes Pricilla Loveless, MD  cyclobenzaprine (FLEXERIL) 10 MG tablet Take 1 tablet (10 mg total) by mouth 2 (two) times daily as needed for muscle spasms. 08/21/21   Carroll Sage, PA-C  fexofenadine (ALLEGRA) 180 MG tablet Take 180 mg by mouth daily.    [provider]    Allergies    Patient has no known allergies.  Review of Systems    Review of Systems  Constitutional:  Negative for fever.  Respiratory:  Negative for shortness of breath.   Cardiovascular:  Negative for chest pain.  Gastrointestinal:  Negative for diarrhea and vomiting.  Musculoskeletal:  Negative for neck pain.  Neurological:  Positive for seizures, syncope and light-headedness. Negative for headaches.  All other systems reviewed and are negative.  Physical Exam Updated Vital Signs BP (!) 139/103   Pulse 93   Temp 98.5 F (36.9 C) (Oral)   Resp 17   Ht 5\' 6"  (1.676 m)   Wt 74.8 kg   SpO2 100%   BMI 26.63 kg/m   Physical Exam Vitals and nursing note reviewed.  Constitutional:      General: He is not in acute distress.    Appearance: He is well-developed. He is not ill-appearing or diaphoretic.  HENT:     Head: Normocephalic.      Right Ear: External ear normal.     Left Ear: External ear normal.     Nose: Nose normal.     Mouth/Throat:     Comments: No tongue injury Eyes:     General:        Right eye: No discharge.        Left eye:  No discharge.     Extraocular Movements: Extraocular movements intact.     Pupils: Pupils are equal, round, and reactive to light.  Cardiovascular:     Rate and Rhythm: Normal rate and regular rhythm.     Heart sounds: Normal heart sounds.  Pulmonary:     Effort: Pulmonary effort is normal.     Breath sounds: Normal breath sounds.  Abdominal:     General: There is no distension.     Palpations: Abdomen is soft.     Tenderness: There is no abdominal tenderness.  Musculoskeletal:     Cervical back: Normal range of motion and neck supple.  Skin:    General: Skin is warm and dry.  Neurological:     Mental Status: He is alert.     Comments: CN 3-12 grossly intact. 5/5 strength in all 4 extremities. Grossly normal sensation. Normal finger to nose.   Psychiatric:        Mood and Affect: Mood is not anxious.    ED Results / Procedures / Treatments   Labs (all labs ordered are listed, but only  abnormal results are displayed) Labs Reviewed  COMPREHENSIVE METABOLIC PANEL - Abnormal; Notable for the following components:      Result Value   Potassium 3.0 (*)    Calcium 8.4 (*)    All other components within normal limits  RAPID URINE DRUG SCREEN, HOSP PERFORMED - Abnormal; Notable for the following components:   Opiates POSITIVE (*)    Benzodiazepines POSITIVE (*)    Tetrahydrocannabinol POSITIVE (*)    All other components within normal limits  CBC WITH DIFFERENTIAL/PLATELET  MAGNESIUM  URINALYSIS, ROUTINE W REFLEX MICROSCOPIC  CBG MONITORING, ED    EKG EKG Interpretation  Date/Time:  Tuesday August 29 2021 12:37:30 EST Ventricular Rate:  97 PR Interval:  154 QRS Duration: 96 QT Interval:  345 QTC Calculation: 439 R Axis:   83 Text Interpretation: Sinus rhythm Consider right atrial enlargement Confirmed by Pricilla Loveless [phone]) on 08/29/2021 3:28:52 PM  Radiology CT HEAD WO CONTRAST  Result Date: 08/29/2021 CLINICAL DATA:  Seizures, syncope EXAM: CT HEAD WITHOUT CONTRAST TECHNIQUE: Contiguous axial images were obtained from the base of the skull through the vertex without intravenous contrast. COMPARISON:  None. FINDINGS: Brain: There are no signs of bleeding within the cranium. Ventricles are not dilated. There is no focal mass effect. There is no shift of midline structures. Vascular: Unremarkable. Skull: Unremarkable. Sinuses/Orbits: There is mild mucosal thickening in the left side of sphenoid sinus. Other: None IMPRESSION: No acute intracranial findings are seen in noncontrast CT brain. Mild chronic sphenoid sinusitis. Electronically Signed   By: Ernie Avena M.D.   On: 08/29/2021 12:50    Procedures Procedures   Medications Ordered in ED Medications  iohexol (OMNIPAQUE) 300 MG/ML solution 100 mL (has no administration in time range)  sodium chloride 0.9 % bolus 1,000 mL (0 mLs Intravenous Stopped 08/29/21 1450)  potassium chloride SA (KLOR-CON M) CR  tablet 40 mEq (40 mEq Oral Given 08/29/21 1323)    ED Course  I have reviewed the triage vital signs and the nursing notes.  Pertinent labs & imaging results that were available during my care of the patient were reviewed by me and considered in my medical decision making (see chart for details).    MDM Rules/Calculators/A&P                           Patient  is well appearing besides mild abrasion to face. CT head is unremarkable.  Sounds like he probably had a seizure though no obvious prior history of previous seizures.  However he also describes near syncope/syncope symptoms with the darkening tunnel vision.  I think you will need outpatient follow-up but I do not think he needs to be emergently started on antiepileptics.  Lab work otherwise is benign besides some mild hypokalemia which will be repleted.  Discharged home with return precautions. Final Clinical Impression(s) / ED Diagnoses Final diagnoses:  Seizure-like activity (HCC)  Hypokalemia    Rx / DC Orders ED Discharge Orders          Ordered    potassium chloride SA (KLOR-CON M) 20 MEQ tablet  Daily        08/29/21 1414             Pricilla Loveless, MD 08/29/21 1529

## 2021-08-29 NOTE — ED Notes (Signed)
Pt verbalized understanding of discharge paper work. Sign pad not working.  

## 2022-01-23 ENCOUNTER — Emergency Department (HOSPITAL_COMMUNITY)
Admission: EM | Admit: 2022-01-23 | Discharge: 2022-01-23 | Discharge status: Against Medical Advice | Payer: Self-pay | Source: Home / Self Care

## 2022-06-12 DIAGNOSIS — R69 Illness, unspecified: Secondary | ICD-10-CM | POA: Diagnosis not present

## 2022-06-26 DIAGNOSIS — R69 Illness, unspecified: Secondary | ICD-10-CM | POA: Diagnosis not present

## 2022-06-28 DIAGNOSIS — R69 Illness, unspecified: Secondary | ICD-10-CM | POA: Diagnosis not present

## 2022-07-03 DIAGNOSIS — K5900 Constipation, unspecified: Secondary | ICD-10-CM | POA: Diagnosis not present

## 2022-07-03 DIAGNOSIS — R69 Illness, unspecified: Secondary | ICD-10-CM | POA: Diagnosis not present

## 2022-07-10 DIAGNOSIS — G4700 Insomnia, unspecified: Secondary | ICD-10-CM | POA: Diagnosis not present

## 2022-07-10 DIAGNOSIS — R69 Illness, unspecified: Secondary | ICD-10-CM | POA: Diagnosis not present

## 2022-07-10 DIAGNOSIS — K5900 Constipation, unspecified: Secondary | ICD-10-CM | POA: Diagnosis not present

## 2022-07-12 DIAGNOSIS — R69 Illness, unspecified: Secondary | ICD-10-CM | POA: Diagnosis not present

## 2022-07-17 DIAGNOSIS — G4700 Insomnia, unspecified: Secondary | ICD-10-CM | POA: Diagnosis not present

## 2022-07-17 DIAGNOSIS — R69 Illness, unspecified: Secondary | ICD-10-CM | POA: Diagnosis not present

## 2022-07-17 DIAGNOSIS — K5900 Constipation, unspecified: Secondary | ICD-10-CM | POA: Diagnosis not present

## 2022-07-24 DIAGNOSIS — R69 Illness, unspecified: Secondary | ICD-10-CM | POA: Diagnosis not present

## 2022-07-24 DIAGNOSIS — G4700 Insomnia, unspecified: Secondary | ICD-10-CM | POA: Diagnosis not present

## 2022-07-31 DIAGNOSIS — K5900 Constipation, unspecified: Secondary | ICD-10-CM | POA: Diagnosis not present

## 2022-07-31 DIAGNOSIS — R69 Illness, unspecified: Secondary | ICD-10-CM | POA: Diagnosis not present

## 2022-07-31 DIAGNOSIS — G4700 Insomnia, unspecified: Secondary | ICD-10-CM | POA: Diagnosis not present

## 2022-08-01 DIAGNOSIS — R69 Illness, unspecified: Secondary | ICD-10-CM | POA: Diagnosis not present

## 2022-08-07 DIAGNOSIS — G4700 Insomnia, unspecified: Secondary | ICD-10-CM | POA: Diagnosis not present

## 2022-08-07 DIAGNOSIS — R69 Illness, unspecified: Secondary | ICD-10-CM | POA: Diagnosis not present

## 2022-08-07 DIAGNOSIS — K5900 Constipation, unspecified: Secondary | ICD-10-CM | POA: Diagnosis not present

## 2022-08-14 DIAGNOSIS — G4700 Insomnia, unspecified: Secondary | ICD-10-CM | POA: Diagnosis not present

## 2022-08-14 DIAGNOSIS — K5900 Constipation, unspecified: Secondary | ICD-10-CM | POA: Diagnosis not present

## 2022-08-14 DIAGNOSIS — R69 Illness, unspecified: Secondary | ICD-10-CM | POA: Diagnosis not present

## 2022-08-21 DIAGNOSIS — G4700 Insomnia, unspecified: Secondary | ICD-10-CM | POA: Diagnosis not present

## 2022-08-21 DIAGNOSIS — K5900 Constipation, unspecified: Secondary | ICD-10-CM | POA: Diagnosis not present

## 2022-08-21 DIAGNOSIS — R69 Illness, unspecified: Secondary | ICD-10-CM | POA: Diagnosis not present

## 2022-08-28 DIAGNOSIS — K5900 Constipation, unspecified: Secondary | ICD-10-CM | POA: Diagnosis not present

## 2022-08-28 DIAGNOSIS — G4700 Insomnia, unspecified: Secondary | ICD-10-CM | POA: Diagnosis not present

## 2022-08-28 DIAGNOSIS — R69 Illness, unspecified: Secondary | ICD-10-CM | POA: Diagnosis not present

## 2022-09-12 DIAGNOSIS — R69 Illness, unspecified: Secondary | ICD-10-CM | POA: Diagnosis not present

## 2022-09-12 DIAGNOSIS — K5900 Constipation, unspecified: Secondary | ICD-10-CM | POA: Diagnosis not present

## 2022-09-12 DIAGNOSIS — G4700 Insomnia, unspecified: Secondary | ICD-10-CM | POA: Diagnosis not present

## 2022-09-26 DIAGNOSIS — R69 Illness, unspecified: Secondary | ICD-10-CM | POA: Diagnosis not present

## 2022-09-26 DIAGNOSIS — K5900 Constipation, unspecified: Secondary | ICD-10-CM | POA: Diagnosis not present

## 2022-09-26 DIAGNOSIS — G4700 Insomnia, unspecified: Secondary | ICD-10-CM | POA: Diagnosis not present

## 2022-10-04 DIAGNOSIS — G4700 Insomnia, unspecified: Secondary | ICD-10-CM | POA: Diagnosis not present

## 2022-10-04 DIAGNOSIS — R69 Illness, unspecified: Secondary | ICD-10-CM | POA: Diagnosis not present

## 2022-10-04 DIAGNOSIS — K5900 Constipation, unspecified: Secondary | ICD-10-CM | POA: Diagnosis not present

## 2022-10-11 DIAGNOSIS — G4700 Insomnia, unspecified: Secondary | ICD-10-CM | POA: Diagnosis not present

## 2022-10-11 DIAGNOSIS — K5900 Constipation, unspecified: Secondary | ICD-10-CM | POA: Diagnosis not present

## 2022-10-11 DIAGNOSIS — R69 Illness, unspecified: Secondary | ICD-10-CM | POA: Diagnosis not present

## 2022-11-14 DIAGNOSIS — F1120 Opioid dependence, uncomplicated: Secondary | ICD-10-CM | POA: Diagnosis not present

## 2022-11-14 DIAGNOSIS — F1490 Cocaine use, unspecified, uncomplicated: Secondary | ICD-10-CM | POA: Diagnosis not present

## 2022-11-14 DIAGNOSIS — G4700 Insomnia, unspecified: Secondary | ICD-10-CM | POA: Diagnosis not present

## 2022-11-14 DIAGNOSIS — F1010 Alcohol abuse, uncomplicated: Secondary | ICD-10-CM | POA: Diagnosis not present

## 2022-11-14 DIAGNOSIS — K5900 Constipation, unspecified: Secondary | ICD-10-CM | POA: Diagnosis not present

## 2022-11-22 DIAGNOSIS — F1010 Alcohol abuse, uncomplicated: Secondary | ICD-10-CM | POA: Diagnosis not present

## 2022-11-22 DIAGNOSIS — G4700 Insomnia, unspecified: Secondary | ICD-10-CM | POA: Diagnosis not present

## 2022-11-22 DIAGNOSIS — F1120 Opioid dependence, uncomplicated: Secondary | ICD-10-CM | POA: Diagnosis not present

## 2022-11-22 DIAGNOSIS — K5900 Constipation, unspecified: Secondary | ICD-10-CM | POA: Diagnosis not present

## 2022-11-29 DIAGNOSIS — G4700 Insomnia, unspecified: Secondary | ICD-10-CM | POA: Diagnosis not present

## 2022-11-29 DIAGNOSIS — F1010 Alcohol abuse, uncomplicated: Secondary | ICD-10-CM | POA: Diagnosis not present

## 2022-11-29 DIAGNOSIS — F1120 Opioid dependence, uncomplicated: Secondary | ICD-10-CM | POA: Diagnosis not present

## 2022-11-29 DIAGNOSIS — F1490 Cocaine use, unspecified, uncomplicated: Secondary | ICD-10-CM | POA: Diagnosis not present

## 2022-12-13 DIAGNOSIS — G4700 Insomnia, unspecified: Secondary | ICD-10-CM | POA: Diagnosis not present

## 2022-12-13 DIAGNOSIS — F1010 Alcohol abuse, uncomplicated: Secondary | ICD-10-CM | POA: Diagnosis not present

## 2022-12-13 DIAGNOSIS — K5900 Constipation, unspecified: Secondary | ICD-10-CM | POA: Diagnosis not present

## 2022-12-13 DIAGNOSIS — F1120 Opioid dependence, uncomplicated: Secondary | ICD-10-CM | POA: Diagnosis not present

## 2023-01-08 DIAGNOSIS — F1120 Opioid dependence, uncomplicated: Secondary | ICD-10-CM | POA: Diagnosis not present

## 2023-01-08 DIAGNOSIS — K5900 Constipation, unspecified: Secondary | ICD-10-CM | POA: Diagnosis not present

## 2023-01-08 DIAGNOSIS — G4700 Insomnia, unspecified: Secondary | ICD-10-CM | POA: Diagnosis not present

## 2023-01-08 DIAGNOSIS — F1010 Alcohol abuse, uncomplicated: Secondary | ICD-10-CM | POA: Diagnosis not present

## 2023-01-08 DIAGNOSIS — F1490 Cocaine use, unspecified, uncomplicated: Secondary | ICD-10-CM | POA: Diagnosis not present

## 2023-01-15 DIAGNOSIS — F1010 Alcohol abuse, uncomplicated: Secondary | ICD-10-CM | POA: Diagnosis not present

## 2023-01-15 DIAGNOSIS — K5900 Constipation, unspecified: Secondary | ICD-10-CM | POA: Diagnosis not present

## 2023-01-15 DIAGNOSIS — F1120 Opioid dependence, uncomplicated: Secondary | ICD-10-CM | POA: Diagnosis not present

## 2023-01-15 DIAGNOSIS — G4700 Insomnia, unspecified: Secondary | ICD-10-CM | POA: Diagnosis not present

## 2023-01-15 DIAGNOSIS — F1490 Cocaine use, unspecified, uncomplicated: Secondary | ICD-10-CM | POA: Diagnosis not present

## 2023-01-23 DIAGNOSIS — G4700 Insomnia, unspecified: Secondary | ICD-10-CM | POA: Diagnosis not present

## 2023-01-23 DIAGNOSIS — F1120 Opioid dependence, uncomplicated: Secondary | ICD-10-CM | POA: Diagnosis not present

## 2023-01-23 DIAGNOSIS — K5900 Constipation, unspecified: Secondary | ICD-10-CM | POA: Diagnosis not present

## 2023-01-23 DIAGNOSIS — F1010 Alcohol abuse, uncomplicated: Secondary | ICD-10-CM | POA: Diagnosis not present

## 2023-01-29 DIAGNOSIS — F1120 Opioid dependence, uncomplicated: Secondary | ICD-10-CM | POA: Diagnosis not present

## 2023-02-12 IMAGING — CT CT HEAD W/O CM
3 series · 16 of 47 positions shown, 19 images · non-contrast
Comparison: None.

CLINICAL DATA: Seizures, syncope

EXAM:
CT HEAD WITHOUT CONTRAST
TECHNIQUE: Contiguous axial images were obtained from the base of the skull
through the vertex without intravenous contrast.

[Series 2: head w o · axial · 0.44mm/px · z∈[+1284,+1414]mm · 10 of 32 slices shown, 13 images]
[im 3/32  brain]
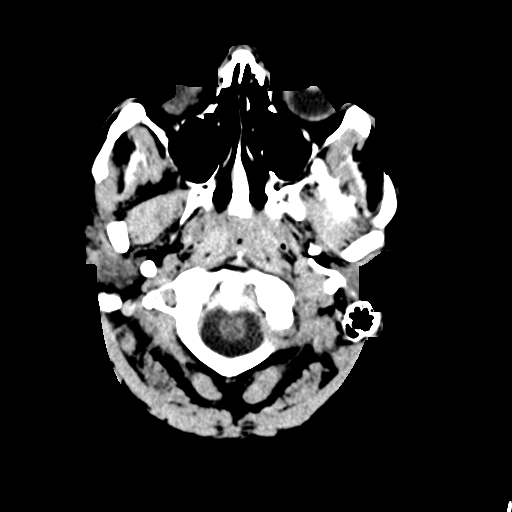
[im 3/32  bone]
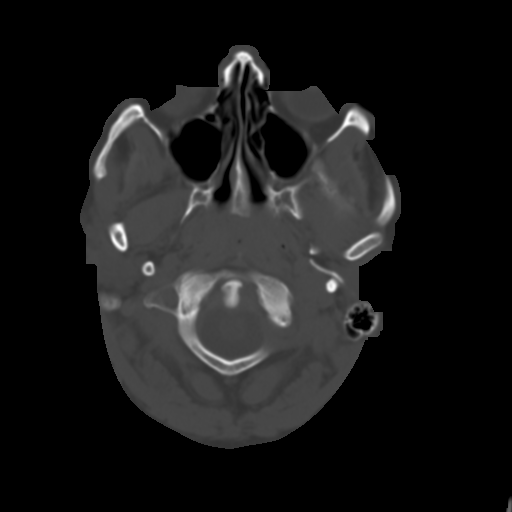
[im 6/32  brain]
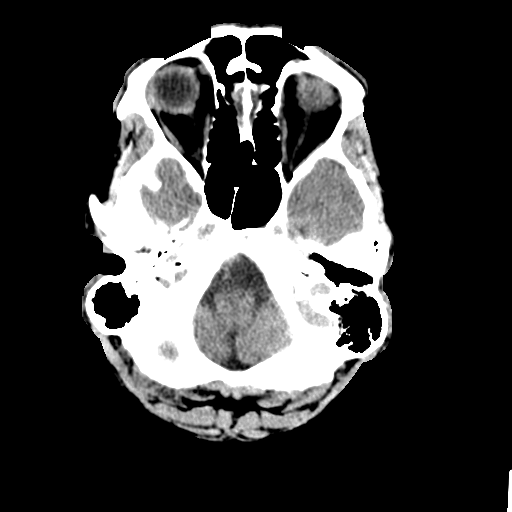
[im 9/32  brain]
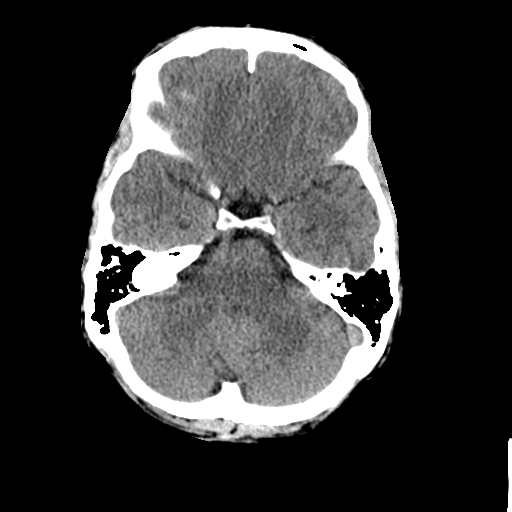
[im 11/32  brain]
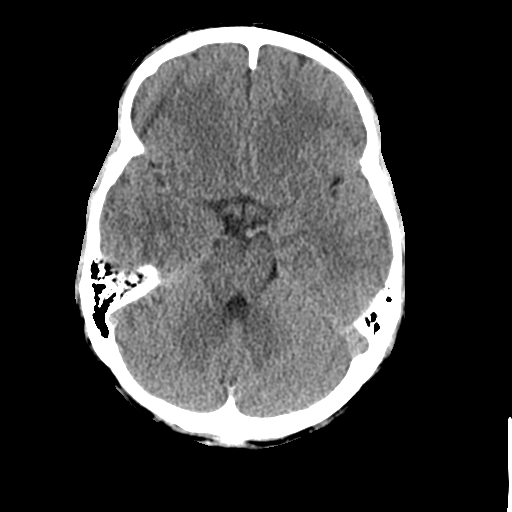
[im 14/32  brain]
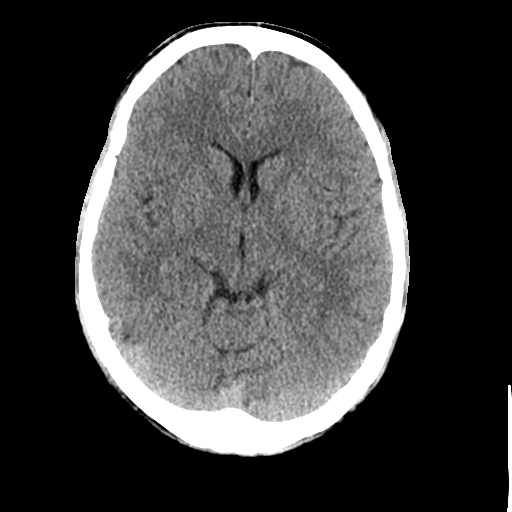
[im 14/32  bone]
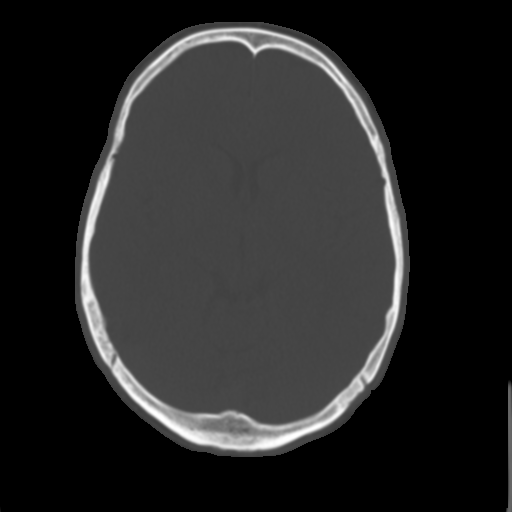
[im 18/32  brain]
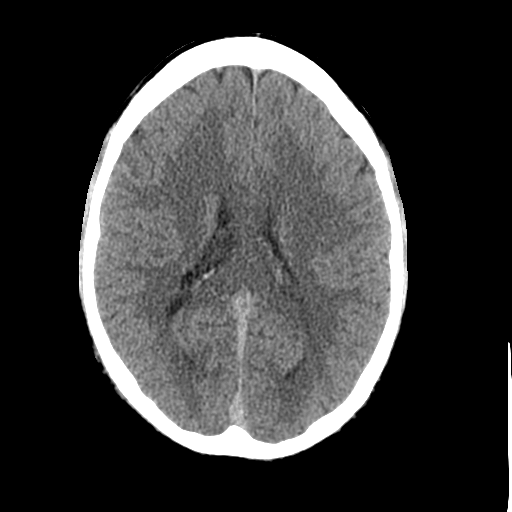
[im 21/32  brain]
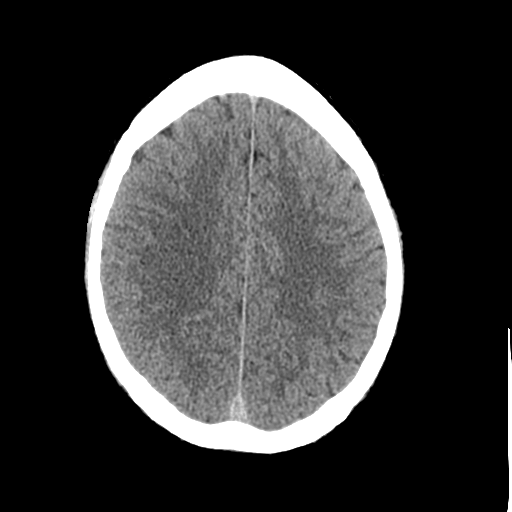
[im 24/32  brain]
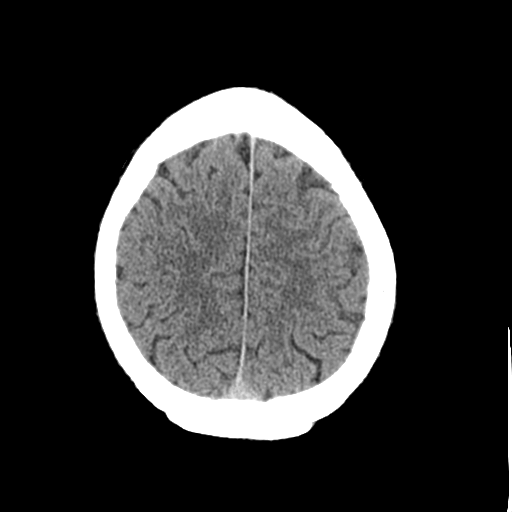
[im 26/32  brain]
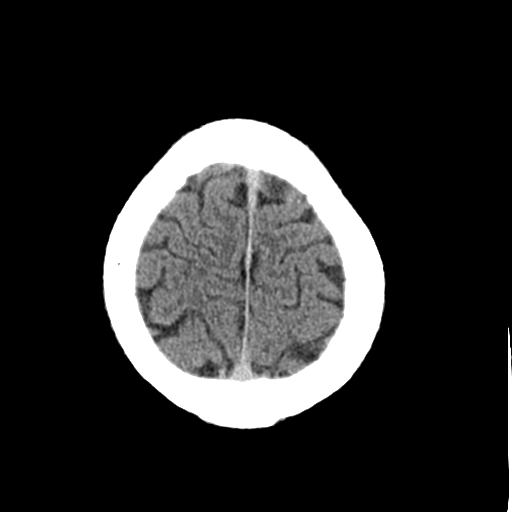
[im 26/32  bone]
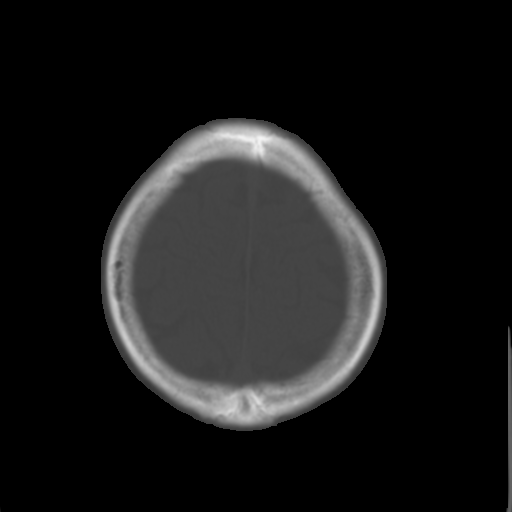
[im 29/32  brain]
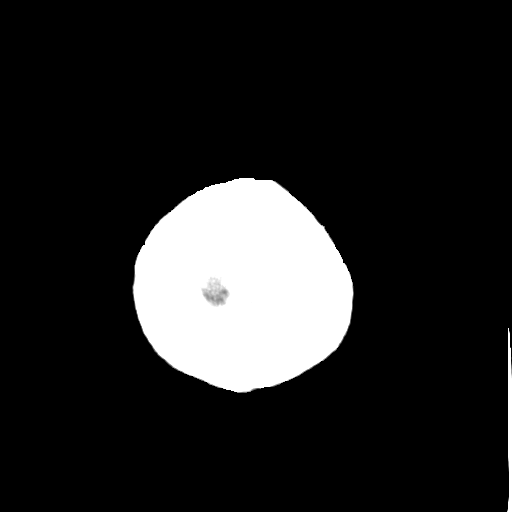

[Series 4: coronal soft · coronal · 0.33mm/px · 3 of 67 slices shown]
[im 23/67  brain]
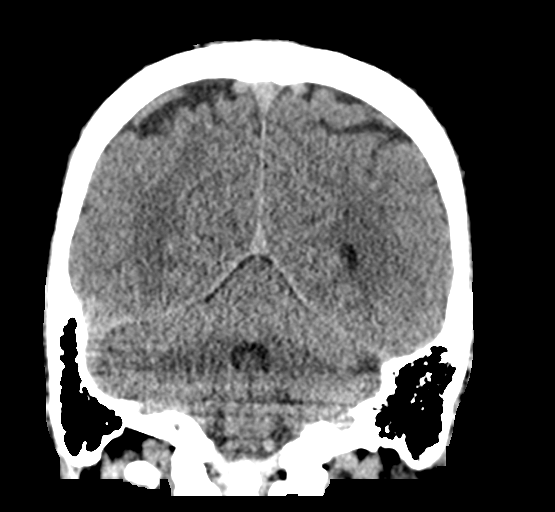
[im 30/67  brain]
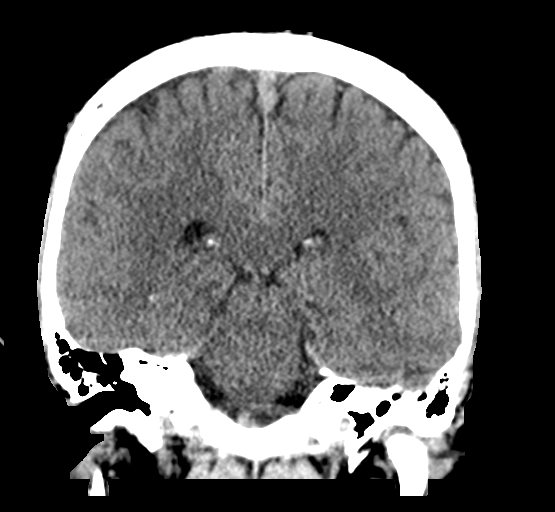
[im 37/67  brain]
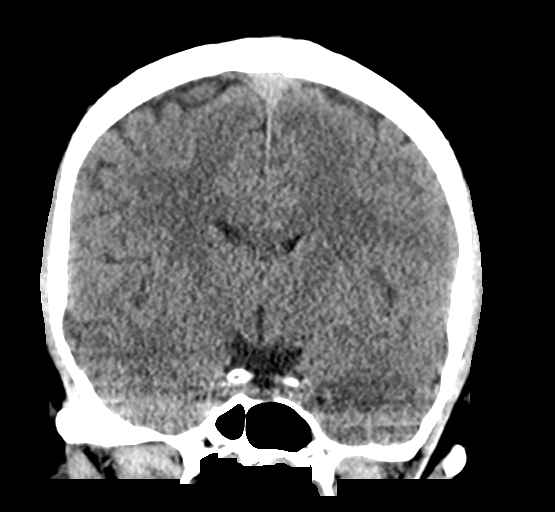

[Series 5: sagittal soft · sagittal · 0.36mm/px · 3 of 58 slices shown]
[im 20/58  brain]
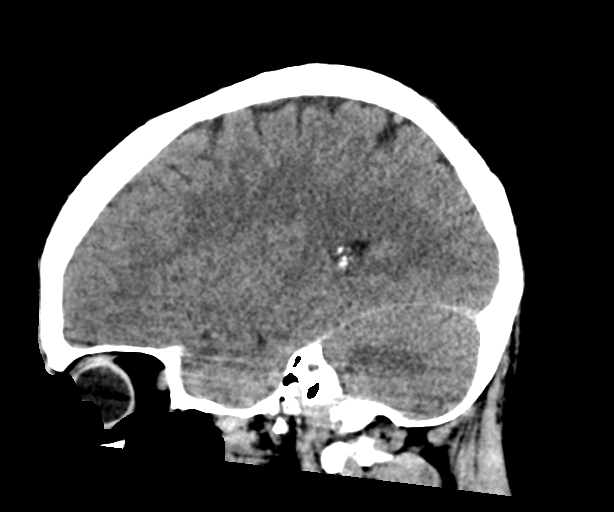
[im 29/58  brain]
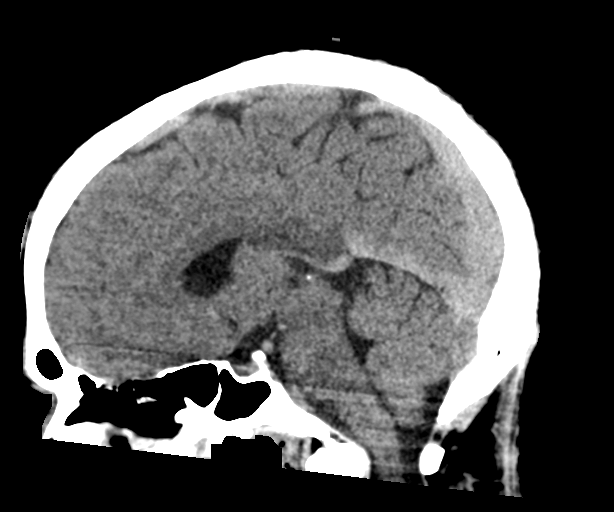
[im 39/58  brain]
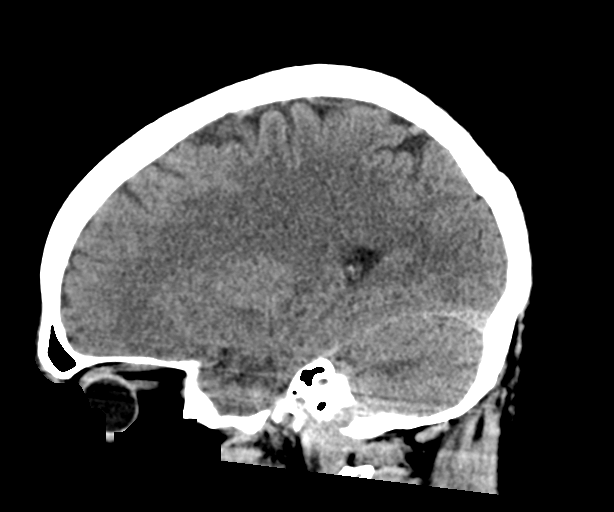

[16 of 47 positions shown; findings below may reference images not displayed]

FINDINGS: Brain: There are no signs of bleeding within the cranium. Ventricles
are not dilated. There is no focal mass effect. There is no shift of
midline structures.

Vascular: Unremarkable.

Skull: Unremarkable.

Sinuses/Orbits: There is mild mucosal thickening in the left side of
sphenoid sinus.

Other: None
IMPRESSION: No acute intracranial findings are seen in noncontrast CT brain.

Mild chronic sphenoid sinusitis.

## 2024-04-06 ENCOUNTER — Ambulatory Visit
Admission: EM | Admit: 2024-04-06 | Discharge: 2024-04-06 | Disposition: A | Discharge status: Home / Self Care | Attending: Nurse Practitioner | Admitting: Nurse Practitioner

## 2024-04-06 DIAGNOSIS — Z8739 Personal history of other diseases of the musculoskeletal system and connective tissue: Secondary | ICD-10-CM

## 2024-04-06 DIAGNOSIS — M545 Low back pain, unspecified: Secondary | ICD-10-CM

## 2024-04-06 MED ORDER — KETOROLAC TROMETHAMINE 30 MG/ML IJ SOLN
30.0000 mg | Freq: Once | INTRAMUSCULAR | Status: AC
  Administered 2024-04-06: 30 mg via INTRAMUSCULAR

## 2024-04-06 MED ORDER — DEXAMETHASONE SODIUM PHOSPHATE 10 MG/ML IJ SOLN
10.0000 mg | INTRAMUSCULAR | Status: AC
  Administered 2024-04-06: 10 mg via INTRAMUSCULAR

## 2024-04-06 MED ORDER — CYCLOBENZAPRINE HCL 10 MG PO TABS: 10.0000 mg | ORAL_TABLET | Freq: Two times a day (BID) | ORAL | 0 refills | Status: DC | PRN

## 2024-04-06 NOTE — Discharge Instructions (Signed)
 You were given injections of Toradol  30 mg and Decadron  10 mg.  Do not take any additional NSAIDs such as ibuprofen , Aleve, Advil , Motrin , or naproxen today.  You may take Tylenol  for breakthrough pain or discomfort. Take medication as prescribed.  The medication will cause drowsiness.  Do not drive, operate heavy equipment, or drink alcohol while taking the medication. Try to remain as active as possible. Gentle range of motion and stretching exercises to help with back spasm and pain.  I have provided exercises for you to perform at least 2-3 times daily while symptoms persist. May apply ice or heat as needed.  Ice is recommended for pain or swelling, heat for spasm or stiffness.  Apply for 20 minutes, remove for 1 hour, then repeat. Go to the emergency department immediately if you develop weakness in your legs or feet, inability to walk, loss of bowel or bladder function, difficulty urinating or passing a bowel movement, or other concerns. Follow-up with your primary care physician if your symptoms do not improve.

## 2024-04-06 NOTE — ED Triage Notes (Signed)
 Pt reports he has low back pain x 2 weeks

## 2024-04-06 NOTE — ED Provider Notes (Signed)
 [facility]    CSN: [id number] Arrival date & time: 04/06/24  1052      History   Chief Complaint No chief complaint on file.   HPI Erik Branch is a 38 y.o. male.   The history is provided by the patient.   Patient presents with a 2-week history of low back pain.  Patient states pain starts in the middle and moves over to the left side of his back.  He also complains of back stiffness and tightness.  Patient reports that he does have a history of chronic back pain.  States that symptoms started after he was in the shower and lifted his leg.  Patient denies injury, trauma, fever, chills, loss of control of bowel or bladder function, lower extremity weakness, bruising, swelling, or the inability to ambulate.  Patient states he has been taking over-the-counter medications for his symptoms with minimal relief.  History reviewed. No pertinent past medical history.  There are no active problems to display for this patient.   Past Surgical History:  Procedure Laterality Date   HERNIA REPAIR         Home Medications    Prior to Admission medications   Medication Sig Start Date End Date Taking? Authorizing Provider  cyclobenzaprine  (FLEXERIL ) 10 MG tablet Take 1 tablet (10 mg total) by mouth 2 (two) times daily as needed for muscle spasms. 08/21/21   Waylan Elsie PARAS, PA-C  fexofenadine (ALLEGRA) 180 MG tablet Take 180 mg by mouth daily.    [provider]  potassium chloride  SA (KLOR-CON  M) 20 MEQ tablet Take 1 tablet (20 mEq total) by mouth daily. 08/29/21   Freddi Hamilton, MD    Family History History reviewed. No pertinent family history.  Social History Social History   Tobacco Use   Smoking status: Every Day    Types: Cigarettes   Smokeless tobacco: Never  Substance Use Topics   Alcohol use: No   Drug use: Not Currently    Types: Marijuana     Allergies   Patient has no known allergies.   Review of Systems Review of  Systems Per HPI  Physical Exam Triage Vital Signs ED Triage Vitals  Encounter Vitals Group     BP 04/06/24 1211 115/76     Girls Systolic BP Percentile --      Girls Diastolic BP Percentile --      Boys Systolic BP Percentile --      Boys Diastolic BP Percentile --      Pulse Rate 04/06/24 1211 61     Resp 04/06/24 1211 16     Temp 04/06/24 1211 98 F (36.7 C)     Temp Source 04/06/24 1211 Oral     SpO2 04/06/24 1211 (!) 6 %     Weight --      Height --      Head Circumference --      Peak Flow --      Pain Score 04/06/24 1210 10     Pain Loc --      Pain Education --      Exclude from Growth Chart --    No data found.  Updated Vital Signs BP 115/76 (BP Location: Right Arm)   Pulse 61   Temp 98 F (36.7 C) (Oral)   Resp 16   SpO2 (!) 6%   Visual Acuity Right Eye Distance:   Left Eye Distance:   Bilateral Distance:    Right Eye Near:  Left Eye Near:    Bilateral Near:     Physical Exam Vitals and nursing note reviewed.  Constitutional:      General: He is not in acute distress.    Appearance: Normal appearance.  HENT:     Head: Normocephalic.  Eyes:     Extraocular Movements: Extraocular movements intact.     Conjunctiva/sclera: Conjunctivae normal.     Pupils: Pupils are equal, round, and reactive to light.  Cardiovascular:     Rate and Rhythm: Normal rate and regular rhythm.     Pulses: Normal pulses.     Heart sounds: Normal heart sounds.  Pulmonary:     Effort: Pulmonary effort is normal. No respiratory distress.     Breath sounds: Normal breath sounds. No stridor. No wheezing, rhonchi or rales.  Abdominal:     General: Bowel sounds are normal.     Palpations: Abdomen is soft.     Tenderness: There is no abdominal tenderness.  Musculoskeletal:     Cervical back: Normal range of motion.     Lumbar back: Spasms present. No swelling, deformity or signs of trauma. Normal range of motion. Positive left straight leg raise test.  Skin:    General:  Skin is warm and dry.  Neurological:     General: No focal deficit present.     Mental Status: He is alert and oriented to person, place, and time.  Psychiatric:        Mood and Affect: Mood normal.        Behavior: Behavior normal.      UC Treatments / Results  Labs (all labs ordered are listed, but only abnormal results are displayed) Labs Reviewed - No data to display  EKG   Radiology No results found.  Procedures Procedures (including critical care time)  Medications Ordered in UC Medications - No data to display  Initial Impression / Assessment and Plan / UC Course  I have reviewed the triage vital signs and the nursing notes.  Pertinent labs & imaging results that were available during my care of the patient were reviewed by me and considered in my medical decision making (see chart for details).  Patient with history of chronic low back pain.  On exam, there is no obvious erythema, redness, or deformity noted to his lower back.  He endorses spasm and stiffness.  Decadron  10 mg IM and Toradol  30 mg IM administered for pain.  Will start patient on cyclobenzaprine  5 mg for pain and spasm.  Supportive care recommendations were provided and discussed with the patient to include continuing over-the-counter analgesics, the use of ice or heat, and stretching exercises.  Patient was given strict ER follow-up precautions.  Patient was in agreement with this plan of care and verbalizes understanding.  All questions were answered.  Patient stable for discharge.   Final Clinical Impressions(s) / UC Diagnoses   Final diagnoses:  None   Discharge Instructions   None    ED Prescriptions   None    PDMP not reviewed this encounter.   Gilmer Etta PARAS, NP 04/06/24 1318

## 2024-09-25 ENCOUNTER — Ambulatory Visit
Admission: RE | Admit: 2024-09-25 | Discharge: 2024-09-25 | Disposition: A | Discharge status: Home / Self Care | Source: Ambulatory Visit | Attending: Nurse Practitioner

## 2024-09-25 ENCOUNTER — Other Ambulatory Visit: Payer: Self-pay

## 2024-09-25 VITALS — BP 117/71 | HR 98 | Temp 98.2°F | Resp 20

## 2024-09-25 DIAGNOSIS — J40 Bronchitis, not specified as acute or chronic: Secondary | ICD-10-CM

## 2024-09-25 LAB — POC SOFIA SARS ANTIGEN FIA: SARS Coronavirus 2 Ag: NEGATIVE

## 2024-09-25 LAB — POCT INFLUENZA A/B
Influenza A, POC: NEGATIVE
Influenza B, POC: NEGATIVE

## 2024-09-25 MED ORDER — AZITHROMYCIN 250 MG PO TABS: 250.0000 mg | ORAL_TABLET | Freq: Every day | ORAL | 0 refills | Status: AC

## 2024-09-25 MED ORDER — PROMETHAZINE-DM 6.25-15 MG/5ML PO SYRP: 5.0000 mL | ORAL_SOLUTION | Freq: Four times a day (QID) | ORAL | 0 refills | Status: AC | PRN

## 2024-09-25 MED ORDER — PREDNISONE 20 MG PO TABS: 40.0000 mg | ORAL_TABLET | Freq: Every day | ORAL | 0 refills | Status: AC

## 2024-09-25 MED ORDER — ALBUTEROL SULFATE HFA 108 (90 BASE) MCG/ACT IN AERS: 1.0000 | INHALATION_SPRAY | Freq: Four times a day (QID) | RESPIRATORY_TRACT | 0 refills | Status: AC | PRN

## 2024-09-25 NOTE — ED Triage Notes (Signed)
 Pt reports had similar over thanksgiving and reports symptoms went away and reports nasal congestion, cough, body aches returned x4 days. Has tried alka seltzer with no change in symptoms.

## 2024-09-25 NOTE — ED Provider Notes (Signed)
 " [facility]    CSN: [id number] Arrival date & time: 09/25/24  1623      History   Chief Complaint Chief Complaint  Patient presents with   Nasal Congestion    HPI Erik Branch is a 39 y.o. male presenting w viral symptoms.   Pt reports had similar over thanksgiving and reports symptoms went away for 2-3 weeks, now reports nasal congestion, cough (deep, harsh, dry), chills, body aches returned x4 days. Has tried alka seltzer with no change in symptoms.  The patient denies a history of pulmonary disease     HPI  History reviewed. No pertinent past medical history.  There are no active problems to display for this patient.   Past Surgical History:  Procedure Laterality Date   HERNIA REPAIR         Home Medications    Prior to Admission medications  Medication Sig Start Date End Date Taking? Authorizing Provider  albuterol (VENTOLIN HFA) 108 (90 Base) MCG/ACT inhaler Inhale 1-2 puffs into the lungs every 6 (six) hours as needed for wheezing or shortness of breath. 09/25/24  Yes Halden Phegley E, PA-C  azithromycin (ZITHROMAX Z-PAK) 250 MG tablet Take 1 tablet (250 mg total) by mouth daily. Two pills (500mg ) day 1. One pill per day (250mg ) days 2-5. 09/25/24  Yes Brandan Glauber E, PA-C  predniSONE (DELTASONE) 20 MG tablet Take 2 tablets (40 mg total) by mouth daily for 5 days. 09/25/24 09/30/24 Yes Tiyana Galla E, PA-C  promethazine-dextromethorphan (PROMETHAZINE-DM) 6.25-15 MG/5ML syrup Take 5 mLs by mouth 4 (four) times daily as needed for cough. 09/25/24  Yes Arlyss Leita BRAVO, PA-C    Family History History reviewed. No pertinent family history.  Social History Social History[1]   Allergies   Patient has no known allergies.   Review of Systems Review of Systems  Constitutional:  Positive for fatigue. Negative for appetite change, chills and fever.  HENT:  Positive for congestion. Negative for ear pain, rhinorrhea, sinus pressure, sinus pain and sore  throat.   Eyes:  Negative for redness and visual disturbance.  Respiratory:  Positive for cough. Negative for chest tightness, shortness of breath and wheezing.   Cardiovascular:  Negative for chest pain and palpitations.  Gastrointestinal:  Negative for abdominal pain, constipation, diarrhea, nausea and vomiting.  Genitourinary:  Negative for dysuria, frequency and urgency.  Musculoskeletal:  Positive for myalgias.  Neurological:  Negative for dizziness, weakness and headaches.  Psychiatric/Behavioral:  Negative for confusion.   All other systems reviewed and are negative.    Physical Exam Triage Vital Signs ED Triage Vitals  Encounter Vitals Group     BP 09/25/24 1630 117/71     Girls Systolic BP Percentile --      Girls Diastolic BP Percentile --      Boys Systolic BP Percentile --      Boys Diastolic BP Percentile --      Pulse Rate 09/25/24 1630 98     Resp 09/25/24 1630 20     Temp 09/25/24 1630 98.2 F (36.8 C)     Temp Source 09/25/24 1630 Oral     SpO2 09/25/24 1630 93 %     Weight --      Height --      Head Circumference --      Peak Flow --      Pain Score 09/25/24 1628 9     Pain Loc --      Pain Education --  Exclude from Growth Chart --    No data found.  Updated Vital Signs BP 117/71 (BP Location: Right Arm)   Pulse 98   Temp 98.2 F (36.8 C) (Oral)   Resp 20   SpO2 93%   Visual Acuity Right Eye Distance:   Left Eye Distance:   Bilateral Distance:    Right Eye Near:   Left Eye Near:    Bilateral Near:     Physical Exam Vitals reviewed.  Constitutional:      General: He is not in acute distress.    Appearance: Normal appearance. He is not ill-appearing.  HENT:     Head: Normocephalic and atraumatic.     Right Ear: Tympanic membrane, ear canal and external ear normal. No tenderness. No middle ear effusion. There is no impacted cerumen. Tympanic membrane is not perforated, erythematous, retracted or bulging.     Left Ear: Tympanic  membrane, ear canal and external ear normal. No tenderness.  No middle ear effusion. There is no impacted cerumen. Tympanic membrane is not perforated, erythematous, retracted or bulging.     Nose: Nose normal. No congestion.     Mouth/Throat:     Mouth: Mucous membranes are moist.     Pharynx: Uvula midline. No oropharyngeal exudate or posterior oropharyngeal erythema.     Tonsils: No tonsillar exudate.  Eyes:     Extraocular Movements: Extraocular movements intact.     Pupils: Pupils are equal, round, and reactive to light.  Cardiovascular:     Rate and Rhythm: Normal rate and regular rhythm.     Heart sounds: Normal heart sounds.  Pulmonary:     Effort: Pulmonary effort is normal.     Breath sounds: Wheezing present. No decreased breath sounds, rhonchi or rales.     Comments: Expiratory wheezes R>L  Abdominal:     Palpations: Abdomen is soft.     Tenderness: There is no abdominal tenderness. There is no guarding or rebound.  Lymphadenopathy:     Cervical: No cervical adenopathy.     Right cervical: No superficial, deep or posterior cervical adenopathy.    Left cervical: No superficial, deep or posterior cervical adenopathy.  Skin:    Comments: No rash   Neurological:     General: No focal deficit present.     Mental Status: He is alert and oriented to person, place, and time.  Psychiatric:        Mood and Affect: Mood normal.        Behavior: Behavior normal.        Thought Content: Thought content normal.        Judgment: Judgment normal.      UC Treatments / Results  Labs (all labs ordered are listed, but only abnormal results are displayed) Labs Reviewed  POCT INFLUENZA A/B - Normal  POC SOFIA SARS ANTIGEN FIA    EKG   Radiology No results found.  Procedures Procedures (including critical care time)  Medications Ordered in UC Medications - No data to display  Initial Impression / Assessment and Plan / UC Course  I have reviewed the triage vital signs  and the nursing notes.  Pertinent labs & imaging results that were available during my care of the patient were reviewed by me and considered in my medical decision making (see chart for details).     Patient is a pleasant 39 y.o. male presenting with worsening cough following recent viral illness. The patient is afebrile and nontachycardic.  Antipyretic has not  been administered today.  Was sick few weeks ago, felt better, and now worse again. On exam, wheezes throughout, worse in the right middle lung fields. Following discussion, will cover for pneumonia with azithromycin. Also sent prednisone burst, albuterol inhaler, promethazine DM. Return precautions as below.   Final Clinical Impressions(s) / UC Diagnoses   Final diagnoses:  Bronchitis     Discharge Instructions      -Your COVID and influenza tests were negative. -Considering your recent illness, we are treating you for inflammation in the lungs, and also covering for infection with an antibiotic -Z-pack antibiotic x5 days -Prednisone, 2 pills taken at the same time for 5 days in a row.  Try taking this earlier in the day as it can give you energy. Avoid NSAIDs like ibuprofen  and alleve while taking this medication as they can increase your risk of stomach upset and even GI bleeding when in combination with a steroid. You can continue tylenol  (acetaminophen ) up to 1000mg  3x daily. -Promethazine DM cough syrup for congestion/cough. This could make you drowsy, so take at night before bed. -Albuterol inhaler as needed for cough, wheezing, shortness of breath, 1 to 2 puffs every 6 hours as needed. -Your cough should slowly get better instead of worse. If you develop a cough productive of dark or red sputum, new shortness of breath, new chest tightness, new fevers, etc - seek additional care.     ED Prescriptions     Medication Sig Dispense Auth. Provider   predniSONE (DELTASONE) 20 MG tablet Take 2 tablets (40 mg total) by mouth  daily for 5 days. 10 tablet Satish Hammers E, PA-C   azithromycin (ZITHROMAX Z-PAK) 250 MG tablet Take 1 tablet (250 mg total) by mouth daily. Two pills (500mg ) day 1. One pill per day (250mg ) days 2-5. 6 tablet Kennisha Qin E, PA-C   albuterol (VENTOLIN HFA) 108 (90 Base) MCG/ACT inhaler Inhale 1-2 puffs into the lungs every 6 (six) hours as needed for wheezing or shortness of breath. 1 each Calil Amor E, PA-C   promethazine-dextromethorphan (PROMETHAZINE-DM) 6.25-15 MG/5ML syrup Take 5 mLs by mouth 4 (four) times daily as needed for cough. 118 mL Randon Somera E, PA-C      PDMP not reviewed this encounter.     [1]  Social History Tobacco Use   Smoking status: Former    Types: Cigarettes   Smokeless tobacco: Never  Vaping Use   Vaping status: Some Days  Substance Use Topics   Alcohol use: No   Drug use: Not Currently    Types: Marijuana     Arlyss Leita BRAVO, PA-C 09/25/24 1657  "

## 2024-09-25 NOTE — Discharge Instructions (Addendum)
-  Your COVID and influenza tests were negative. -Considering your recent illness, we are treating you for inflammation in the lungs, and also covering for infection with an antibiotic -Z-pack antibiotic x5 days -Prednisone, 2 pills taken at the same time for 5 days in a row.  Try taking this earlier in the day as it can give you energy. Avoid NSAIDs like ibuprofen  and alleve while taking this medication as they can increase your risk of stomach upset and even GI bleeding when in combination with a steroid. You can continue tylenol  (acetaminophen ) up to 1000mg  3x daily. -Promethazine DM cough syrup for congestion/cough. This could make you drowsy, so take at night before bed. -Albuterol inhaler as needed for cough, wheezing, shortness of breath, 1 to 2 puffs every 6 hours as needed. -Your cough should slowly get better instead of worse. If you develop a cough productive of dark or red sputum, new shortness of breath, new chest tightness, new fevers, etc - seek additional care.
